# Patient Record
Sex: Female | Born: 1970 | Race: White | Hispanic: Yes | Marital: Married | Smoking: Never smoker
Health system: Southern US, Community
[De-identification: ages and names within clinical notes are randomized; demographics above are authoritative.]

## PROBLEM LIST (undated history)

## (undated) DIAGNOSIS — F329 Major depressive disorder, single episode, unspecified: Secondary | ICD-10-CM

## (undated) DIAGNOSIS — F32A Depression, unspecified: Secondary | ICD-10-CM

## (undated) DIAGNOSIS — I10 Essential (primary) hypertension: Secondary | ICD-10-CM

## (undated) DIAGNOSIS — G43909 Migraine, unspecified, not intractable, without status migrainosus: Secondary | ICD-10-CM

## (undated) HISTORY — DX: Depression, unspecified: F32.A

## (undated) HISTORY — DX: Migraine, unspecified, not intractable, without status migrainosus: G43.909

---
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
## 2003-12-21 ENCOUNTER — Ambulatory Visit (HOSPITAL_COMMUNITY): Admission: RE | Admit: 2003-12-21 | Discharge: 2003-12-21 | Payer: Self-pay | Admitting: Internal Medicine

## 2004-05-23 ENCOUNTER — Ambulatory Visit: Payer: Self-pay | Admitting: Nurse Practitioner

## 2004-08-22 ENCOUNTER — Ambulatory Visit (HOSPITAL_COMMUNITY): Admission: RE | Admit: 2004-08-22 | Discharge: 2004-08-22 | Payer: Self-pay | Admitting: *Deleted

## 2005-01-06 ENCOUNTER — Inpatient Hospital Stay (HOSPITAL_COMMUNITY): Admission: AD | Admit: 2005-01-06 | Discharge: 2005-01-08 | Payer: Self-pay | Admitting: Obstetrics & Gynecology

## 2005-01-06 ENCOUNTER — Ambulatory Visit: Payer: Self-pay | Admitting: *Deleted

## 2012-07-04 ENCOUNTER — Encounter (HOSPITAL_COMMUNITY): Payer: Self-pay | Admitting: *Deleted

## 2012-07-04 ENCOUNTER — Emergency Department (HOSPITAL_COMMUNITY)
Admission: EM | Admit: 2012-07-04 | Discharge: 2012-07-05 | Disposition: A | Discharge status: Home / Self Care | Payer: Self-pay | Attending: Emergency Medicine | Admitting: Emergency Medicine

## 2012-07-04 DIAGNOSIS — R109 Unspecified abdominal pain: Secondary | ICD-10-CM | POA: Insufficient documentation

## 2012-07-04 DIAGNOSIS — R1110 Vomiting, unspecified: Secondary | ICD-10-CM

## 2012-07-04 DIAGNOSIS — R112 Nausea with vomiting, unspecified: Secondary | ICD-10-CM | POA: Insufficient documentation

## 2012-07-04 DIAGNOSIS — R6883 Chills (without fever): Secondary | ICD-10-CM | POA: Insufficient documentation

## 2012-07-04 DIAGNOSIS — R51 Headache: Secondary | ICD-10-CM | POA: Insufficient documentation

## 2012-07-04 LAB — COMPREHENSIVE METABOLIC PANEL
ALT: 18 U/L (ref 0–35)
Albumin: 4 g/dL (ref 3.5–5.2)
Alkaline Phosphatase: 90 U/L (ref 39–117)
BUN: 6 mg/dL (ref 6–23)
Calcium: 8.6 mg/dL (ref 8.4–10.5)
Potassium: 3.2 mEq/L — ABNORMAL LOW (ref 3.5–5.1)
Sodium: 130 mEq/L — ABNORMAL LOW (ref 135–145)
Total Protein: 7.7 g/dL (ref 6.0–8.3)

## 2012-07-04 LAB — CBC WITH DIFFERENTIAL/PLATELET
Basophils Relative: 0 % (ref 0–1)
Eosinophils Absolute: 0 10*3/uL (ref 0.0–0.7)
Eosinophils Relative: 0 % (ref 0–5)
MCH: 26.9 pg (ref 26.0–34.0)
MCHC: 33.9 g/dL (ref 30.0–36.0)
Neutrophils Relative %: 91 % — ABNORMAL HIGH (ref 43–77)
Platelets: 268 10*3/uL (ref 150–400)
RDW: 13.8 % (ref 11.5–15.5)

## 2012-07-04 MED ORDER — MORPHINE SULFATE 4 MG/ML IJ SOLN
6.0000 mg | Freq: Once | INTRAMUSCULAR | Status: AC
  Administered 2012-07-04: 6 mg via INTRAVENOUS
  Filled 2012-07-04: qty 2

## 2012-07-04 MED ORDER — SODIUM CHLORIDE 0.9 % IV BOLUS (SEPSIS)
1000.0000 mL | Freq: Once | INTRAVENOUS | Status: AC
  Administered 2012-07-04: 1000 mL via INTRAVENOUS

## 2012-07-04 MED ORDER — ONDANSETRON HCL 4 MG/2ML IJ SOLN
4.0000 mg | Freq: Once | INTRAMUSCULAR | Status: AC
  Administered 2012-07-04: 4 mg via INTRAVENOUS
  Filled 2012-07-04: qty 2

## 2012-07-04 NOTE — ED Provider Notes (Signed)
History     CSN: [id number]  Arrival date & time 07/04/12  2221   First MD Initiated Contact with Patient 07/04/12 2246      Chief Complaint  Patient presents with  . Emesis  . Chills    (Consider location/radiation/quality/duration/timing/severity/associated sxs/prior treatment) HPI  Caroline Patton is a 41 y.o. female complaining of multiple episodes of nonbloody nonbilious emesis starting today at 3:30. Patient also reports headache, fever/chills and neck pain. She reports abdominal discomfort and denies diarrhea, dysuria, shortness of breath, cough, sore throat, myalgia, sick contacts. HA is 10/10 bilateral frontal. Pt has not had flu shot this year.   History reviewed. No pertinent past medical history.  History reviewed. No pertinent past surgical history.  No family history on file.  History  Substance Use Topics  . Smoking status: Never Smoker   . Smokeless tobacco: Not on file  . Alcohol Use: No    OB History    Grav Para Term Preterm Abortions TAB SAB Ect Mult Living                  Review of Systems  Constitutional: Negative for fever.  Respiratory: Negative for shortness of breath.   Cardiovascular: Negative for chest pain.  Gastrointestinal: Positive for nausea, vomiting and abdominal pain. Negative for diarrhea.  Neurological: Positive for headaches.  All other systems reviewed and are negative.    Allergies  Review of patient's allergies indicates no known allergies.  Home Medications   Current Outpatient Rx  Name  Route  Sig  Dispense  Refill  . ACETAMINOPHEN 325 MG PO TABS   Oral   Take 650 mg by mouth every 6 (six) hours as needed. For headaches           BP 161/99  Pulse 102  Temp 97.8 F (36.6 C) (Oral)  Resp 16  SpO2 99%  LMP 07/04/2012  Physical Exam  Nursing note and vitals reviewed. Constitutional: She is oriented to person, place, and time. She appears well-developed and well-nourished. No distress.  HENT:    Head: Normocephalic.  Mouth/Throat: Oropharynx is clear and moist.  Eyes: Conjunctivae normal and EOM are normal. Pupils are equal, round, and reactive to light.  Neck: Normal range of motion. Neck supple.       No meningeal signs, FROM   Cardiovascular: Normal rate, regular rhythm and intact distal pulses.   Pulmonary/Chest: Effort normal and breath sounds normal. No stridor. No respiratory distress. She has no wheezes. She has no rales. She exhibits no tenderness.  Abdominal: Soft. Bowel sounds are normal. She exhibits no distension and no mass. There is no tenderness. There is no rebound and no guarding.  Musculoskeletal: Normal range of motion.  Neurological: She is alert and oriented to person, place, and time.       Strength is 5/5 x4 extremities, distal sensation grossly intact. Pt ambulates with a coordinated gait.   Psychiatric: She has a normal mood and affect.    ED Course  Procedures (including critical care time)  Labs Reviewed  CBC WITH DIFFERENTIAL - Abnormal; Notable for the following:    Neutrophils Relative 91 (*)     Neutro Abs 9.6 (*)     Lymphocytes Relative 7 (*)     Monocytes Relative 2 (*)     All other components within normal limits  COMPREHENSIVE METABOLIC PANEL - Abnormal; Notable for the following:    Sodium 130 (*)     Potassium 3.2 (*)  Glucose, Bld 144 (*)     Creatinine, Ser 0.35 (*)     Total Bilirubin 0.2 (*)     All other components within normal limits   No results found.   1. Vomiting       MDM  The patient reports subjective improvement after IV fluids, Zofran and morphine. Patient is mildly hypokalemic at 3.2 over cleared her orally this will also be a by mouth challenge.  Patient is tolerating by mouth and is amenable to discharge. I will discharge her with Zofran and Percocet. Return precautions given      Wynetta Emery, PA-C 07/05/12 [phone]

## 2012-07-04 NOTE — ED Notes (Signed)
Patient states she is unable to eat or drink starting about 1530 today, patient also c/o fevers/chills

## 2012-07-05 MED ORDER — EPINEPHRINE HCL 0.1 MG/ML IJ SOLN
INTRAMUSCULAR | Status: AC
  Filled 2012-07-05: qty 40

## 2012-07-05 MED ORDER — POTASSIUM CHLORIDE 20 MEQ/15ML (10%) PO LIQD
40.0000 meq | Freq: Once | ORAL | Status: AC
  Administered 2012-07-05: 40 meq via ORAL
  Filled 2012-07-05: qty 30

## 2012-07-05 MED ORDER — ONDANSETRON HCL 4 MG PO TABS: 4.0000 mg | ORAL_TABLET | Freq: Three times a day (TID) | ORAL | Status: DC | PRN

## 2012-07-05 MED ORDER — OXYCODONE-ACETAMINOPHEN 5-325 MG PO TABS: ORAL_TABLET | ORAL | Status: DC

## 2012-07-05 NOTE — ED Notes (Signed)
Pt tolerates water well.  Pt states that it doesn't bother her to drink now.

## 2012-07-06 NOTE — ED Provider Notes (Signed)
Medical screening examination/treatment/procedure(s) were performed by non-physician practitioner and as supervising physician I was immediately available for consultation/collaboration.   Lyanne Co, MD 07/06/12 2052

## 2013-07-01 ENCOUNTER — Ambulatory Visit: Payer: Self-pay | Admitting: Emergency Medicine

## 2013-07-01 VITALS — BP 120/82 | HR 72 | Temp 98.0°F | Resp 16 | Ht 63.75 in | Wt 173.8 lb

## 2013-07-01 DIAGNOSIS — G43909 Migraine, unspecified, not intractable, without status migrainosus: Secondary | ICD-10-CM

## 2013-07-01 MED ORDER — BUTALBITAL-APAP-CAFFEINE 50-325-40 MG PO TABS: 1.0000 | ORAL_TABLET | Freq: Four times a day (QID) | ORAL | Status: AC | PRN

## 2013-07-01 NOTE — Progress Notes (Signed)
  Subjective:    Patient ID: Caroline Patton, female    DOB: 17-May-1971, 42 y.o.   MRN: [id number]  HPI 42 yo female with complaint of headache.  Onset over weekend.  Positive blurry vision and nausea.  Frontal headache.  Took Motrin with mild relief, but not complete.  No diagnosis of chronic headaches in past.  Admits to similar headaches, not always as severe about 1-2x/month for past 10 plus years.  Never saw MD about except on ED visit last year.  PPMH:  Negative for diabetes or hypertension  SH:  Non smoker   Review of Systems  Constitutional: Negative for fever and chills.  HENT: Negative for postnasal drip, rhinorrhea and sinus pressure.   Eyes: Positive for photophobia and visual disturbance.  Gastrointestinal: Positive for nausea and vomiting.  Musculoskeletal: Negative for neck pain and neck stiffness.  Skin: Negative for rash.  Neurological: Positive for headaches. Negative for weakness and numbness.       Objective:   Physical Exam Blood pressure 120/82, pulse 72, temperature 98 F (36.7 C), temperature source Oral, resp. rate 16, height 5' 3.75" (1.619 m), weight 173 lb 12.8 oz (78.835 kg), last menstrual period 06/23/2013, SpO2 98.00%. Body mass index is 30.08 kg/(m^2). Well-developed, well nourished female who is awake, alert and oriented, in NAD. HEENT: [place]/AT, PERRL, EOMI.  Sclera and conjunctiva are clear. OP is clear. Neck: supple, non-tender, negative Kernig's/Brudzinski's Heart: RRR, no murmur Lungs: normal effort, CTA Abdomen: normo-active bowel sounds, supple, non-tender, no mass or organomegaly. Extremities: no cyanosis, clubbing or edema. Skin: warm and dry without rash. Psychologic: good mood and appropriate affect, normal speech and behavior. Neuro:  No weakness or numbness.  Sensation intact.  CN2-12 intact.        Assessment & Plan:  Symptoms consistent with migraine headache pattern with onset years ago.  Will give rx for Fioricet to take  when headaches start.  Diganosis:  Migraine.

## 2013-07-01 NOTE — Patient Instructions (Signed)
Cefalea migrañosa °(Migraine Headache) ° Una migraña es un dolor intenso y punzante en uno o ambos lados de la cabeza. Puede durar desde 30 minutos hasta varias horas.  °CAUSAS  °No siempre se conoce la causa exacta. Sin embargo, pueden aparecer cuando los nervios del cerebro se irritan y liberan ciertas sustancias químicas que causan inflamación. Esto ocasiona dolor.  °SÍNTOMAS  °· Dolor en uno o ambos lados de la cabeza. °· Dolor punzante o con pulsaciones. °· Dolor que es lo suficientemente grave en intensidad como para impedir las actividades habituales. °· Se agrava por cualquier actividad física habitual. °· Náuseas, vómitos o ambos. °· Mareos. °· Dolor ante la exposición a luces brillantes o a ruidos fuertes o con la actividad. °· Sensibilidad general a las luces brillantes, a los ruidos fuertes o a los olores. °Antes de sentir a una migraña, puede recibir señales de advertencia de que está por aparecer (aura). Un aura puede incluir:  °· Visión de luces intermitentes. °· Visión de puntos brillantes, halos o líneas en zigzag. °· Visión en túnel o visión borrosa. °· Sensación de entumecimiento u hormigueo. °· Tener dificultad para hablar. °· Tener debilidad muscular. °DISPARADORES DE LA CEFALEA MIGRAÑOSA °· Beber alcohol. °· El hábito de fumar. °· El estrés. °· Con la menstruación. °· Quesos estacionados. °· Alimentos o bebidas que contienen nitratos, glutamato, aspartamo o tiramina. °· La falta de sueño. °· Chocolate. °· Cafeína. °· Hambre. °· Con una actividad física extenuante. °· Fatiga. °· Los medicamentos utilizados para tratar el dolor en el pecho (nitroglicerina), las píldoras anticonceptivas, los estrógenos y algunos medicamentos para la hipertensión pueden provocarla. °DIAGNÓSTICO °Una cefalea migrañosa a menudo se diagnostica según: °· Síntomas. °· Examen físico. °· Una TC (tomografía computada) o resonancia magnética de la cabeza. °TRATAMIENTO °Le prescribirán medicamentos para aliviar el dolor y  las náuseas. También podrán administrarse medicamentos para ayudar a prevenir las migrañas recurrentes.  °INSTRUCCIONES PARA EL CUIDADO EN EL HOGAR  °· Sólo tome medicamentos de venta libre o recetados para calmar el dolor o el malestar, según las indicaciones de su médico. No se recomienda usar analgésicos narcóticos a largo plazo. °· Acuéstese en un cuarto oscuro y tranquilo cuando tiene migraña. °· Lleve un registro diario para averiguar lo que puede provocar dolores de cabeza por la migraña. Por ejemplo, escriba: °· Lo que come y bebe. °· Cuánto tiempo duerme. °· Todo cambio en la dieta o medicamentos. °· Limite el consumo de bebidas alcohólicas. °· Si fuma, deje de hacerlo. °· Duerma entre 7 y 9 horas o como lo indique su médico. °· Limite las situaciones de estrés. °· Mantenga las luces tenues si le molestan las luces brillantes y la migraña empeora. °SOLICITE ATENCIÓN MÉDICA DE INMEDIATO SI:  °· La migraña se hace cada vez más intensa. °· Tiene fiebre. °· Presenta rigidez en el cuello. °· Tiene pérdida de visión. °· Presenta debilidad muscular o pérdida del control muscular. °· Comienza a perder el equilibrio o tiene problemas para caminar. °· Sufre mareos o se desmaya. °· Tiene síntomas graves que son diferentes a los primeros síntomas. °ASEGÚRESE QUE:  °· Comprende estas instrucciones. °· Controlará su enfermedad. °· Solicitará ayuda de inmediato si no mejora o empeora. °Document Released: 07/31/2005 Document Revised: 10/23/2011 °Exit[employer]® Patient Information ©2014 [employer]. ° °

## 2014-05-04 ENCOUNTER — Other Ambulatory Visit (HOSPITAL_COMMUNITY): Payer: Self-pay | Admitting: Nurse Practitioner

## 2014-05-04 DIAGNOSIS — Z1231 Encounter for screening mammogram for malignant neoplasm of breast: Secondary | ICD-10-CM

## 2014-05-20 ENCOUNTER — Ambulatory Visit (HOSPITAL_COMMUNITY)
Admission: RE | Admit: 2014-05-20 | Discharge: 2014-05-20 | Disposition: A | Discharge status: Home / Self Care | Payer: Self-pay | Source: Ambulatory Visit | Attending: Nurse Practitioner | Admitting: Nurse Practitioner

## 2014-05-20 DIAGNOSIS — Z1231 Encounter for screening mammogram for malignant neoplasm of breast: Secondary | ICD-10-CM

## 2015-02-19 ENCOUNTER — Ambulatory Visit (INDEPENDENT_AMBULATORY_CARE_PROVIDER_SITE_OTHER): Payer: Self-pay | Admitting: Emergency Medicine

## 2015-02-19 VITALS — BP 120/80 | HR 68 | Temp 98.3°F | Resp 16 | Ht 64.0 in | Wt 163.6 lb

## 2015-02-19 DIAGNOSIS — L84 Corns and callosities: Secondary | ICD-10-CM

## 2015-02-19 NOTE — Progress Notes (Signed)
Subjective:  Patient ID: Caroline Patton, female    DOB: 10-15-1970  Age: 44 y.o. MRN: [id number]  CC: Foot Injury   HPI Caroline Patton presents  With pain on the lateral surface of he rleft fourth toe.she has been wearing shoes at work that are too tight for her and she has a callus formation on her toe. There is some somewhat painful. She tried using a wart freezing compound that fails to improve her situation.  History Colleen has no past medical history on file.   She has no past surgical history on file.   Her  family history is not on file.  She   reports that she has never smoked. She does not have any smokeless tobacco history on file. She reports that she does not drink alcohol or use illicit drugs.  Outpatient Prescriptions Prior to Visit  Medication Sig Dispense Refill  . acetaminophen (TYLENOL) 325 MG tablet Take 650 mg by mouth every 6 (six) hours as needed. For headaches    . ibuprofen (ADVIL,MOTRIN) 200 MG tablet Take 400 mg by mouth every 6 (six) hours as needed for headache.    . ondansetron (ZOFRAN) 4 MG tablet Take 1 tablet (4 mg total) by mouth every 8 (eight) hours as needed for nausea. (Patient not taking: Reported on 02/19/2015) 10 tablet 0  . oxyCODONE-acetaminophen (PERCOCET/ROXICET) 5-325 MG per tablet 1 to 2 tabs PO q6hrs  PRN for pain (Patient not taking: Reported on 02/19/2015) 15 tablet 0   No facility-administered medications prior to visit.    History   Social History  . Marital Status: Married    Spouse Name: N/A  . Number of Children: N/A  . Years of Education: N/A   Social History Main Topics  . Smoking status: Never Smoker   . Smokeless tobacco: Not on file  . Alcohol Use: No  . Drug Use: No  . Sexual Activity: Not on file   Other Topics Concern  . None   Social History Narrative     Review of Systems  Constitutional: Negative for fever, chills and appetite change.  HENT: Negative for congestion, ear pain,  postnasal drip, sinus pressure and sore throat.   Eyes: Negative for pain and redness.  Respiratory: Negative for cough, shortness of breath and wheezing.   Cardiovascular: Negative for leg swelling.  Gastrointestinal: Negative for nausea, vomiting, abdominal pain, diarrhea, constipation and blood in stool.  Endocrine: Negative for polyuria.  Genitourinary: Negative for dysuria, urgency, frequency and flank pain.  Musculoskeletal: Negative for gait problem.  Skin: Negative for rash.  Neurological: Negative for weakness and headaches.  Psychiatric/Behavioral: Negative for confusion and decreased concentration. The patient is not nervous/anxious.     Objective:  BP 120/80 mmHg  Pulse 68  Temp(Src) 98.3 F (36.8 C) (Oral)  Resp 16  Ht 5\' 4"  (1.626 m)  Wt 163 lb 9.6 oz (74.208 kg)  BMI 28.07 kg/m2  SpO2 98%  LMP 02/13/2015  Physical Exam  Constitutional: She is oriented to person, place, and time. She appears well-developed and well-nourished.  HENT:  Head: Normocephalic and atraumatic.  Eyes: Conjunctivae are normal. Pupils are equal, round, and reactive to light.  Pulmonary/Chest: Effort normal.  Musculoskeletal: She exhibits no edema.  Neurological: She is alert and oriented to person, place, and time.  Skin: Skin is dry.  Psychiatric: She has a normal mood and affect. Her behavior is normal. Thought content normal.  she has a callus on the lateral surface of her fourth  toe. No cellulitis or evidence of infection   Assessment & Plan:   Eithel was seen today for foot injury.  Diagnoses and all orders for this visit:  Callus   I am having Ms. Patton maintain her acetaminophen, ondansetron, oxyCODONE-acetaminophen, and ibuprofen.  No orders of the defined types were placed in this encounter.   She was instructed to use a cotton pad between her 2 toes pending purchase of new parachutist.  Appropriate red flag conditions were discussed with the patient as well  as actions that should be taken.  Patient expressed his understanding.  Follow-up: Return if symptoms worsen or fail to improve.  Carmelina Dane, MD

## 2015-05-13 ENCOUNTER — Other Ambulatory Visit: Payer: Self-pay | Admitting: Nurse Practitioner

## 2015-05-13 DIAGNOSIS — Z1231 Encounter for screening mammogram for malignant neoplasm of breast: Secondary | ICD-10-CM

## 2015-05-26 ENCOUNTER — Ambulatory Visit
Admission: RE | Admit: 2015-05-26 | Discharge: 2015-05-26 | Disposition: A | Discharge status: Home / Self Care | Payer: No Typology Code available for payment source | Source: Ambulatory Visit | Attending: Nurse Practitioner | Admitting: Nurse Practitioner

## 2015-05-26 DIAGNOSIS — Z1231 Encounter for screening mammogram for malignant neoplasm of breast: Secondary | ICD-10-CM

## 2016-04-16 ENCOUNTER — Emergency Department (HOSPITAL_COMMUNITY): Payer: Self-pay

## 2016-04-16 ENCOUNTER — Emergency Department (HOSPITAL_COMMUNITY)
Admission: EM | Admit: 2016-04-16 | Discharge: 2016-04-16 | Disposition: A | Discharge status: Home / Self Care | Payer: Self-pay | Attending: Emergency Medicine | Admitting: Emergency Medicine

## 2016-04-16 ENCOUNTER — Encounter (HOSPITAL_COMMUNITY): Payer: Self-pay | Admitting: Nurse Practitioner

## 2016-04-16 DIAGNOSIS — R030 Elevated blood-pressure reading, without diagnosis of hypertension: Secondary | ICD-10-CM | POA: Insufficient documentation

## 2016-04-16 DIAGNOSIS — IMO0001 Reserved for inherently not codable concepts without codable children: Secondary | ICD-10-CM

## 2016-04-16 DIAGNOSIS — R51 Headache: Secondary | ICD-10-CM | POA: Insufficient documentation

## 2016-04-16 DIAGNOSIS — R519 Headache, unspecified: Secondary | ICD-10-CM

## 2016-04-16 LAB — I-STAT CHEM 8, ED
BUN: 6 mg/dL (ref 6–20)
CHLORIDE: 104 mmol/L (ref 101–111)
Calcium, Ion: 1.09 mmol/L — ABNORMAL LOW (ref 1.15–1.40)
Creatinine, Ser: 0.3 mg/dL — ABNORMAL LOW (ref 0.44–1.00)
Glucose, Bld: 132 mg/dL — ABNORMAL HIGH (ref 65–99)
HEMATOCRIT: 41 % (ref 36.0–46.0)
Hemoglobin: 13.9 g/dL (ref 12.0–15.0)
Potassium: 3.6 mmol/L (ref 3.5–5.1)
SODIUM: 138 mmol/L (ref 135–145)
TCO2: 23 mmol/L (ref 0–100)

## 2016-04-16 MED ORDER — DEXAMETHASONE SODIUM PHOSPHATE 10 MG/ML IJ SOLN
10.0000 mg | Freq: Once | INTRAMUSCULAR | Status: AC
  Administered 2016-04-16: 10 mg via INTRAVENOUS
  Filled 2016-04-16: qty 1

## 2016-04-16 MED ORDER — SODIUM CHLORIDE 0.9 % IV BOLUS (SEPSIS)
1000.0000 mL | Freq: Once | INTRAVENOUS | Status: AC
  Administered 2016-04-16: 1000 mL via INTRAVENOUS

## 2016-04-16 MED ORDER — KETOROLAC TROMETHAMINE 30 MG/ML IJ SOLN
15.0000 mg | Freq: Once | INTRAMUSCULAR | Status: AC
  Administered 2016-04-16: 15 mg via INTRAVENOUS
  Filled 2016-04-16: qty 1

## 2016-04-16 MED ORDER — METOCLOPRAMIDE HCL 5 MG/ML IJ SOLN
10.0000 mg | Freq: Once | INTRAMUSCULAR | Status: AC
  Administered 2016-04-16: 10 mg via INTRAVENOUS
  Filled 2016-04-16: qty 2

## 2016-04-16 MED ORDER — DIPHENHYDRAMINE HCL 50 MG/ML IJ SOLN
25.0000 mg | Freq: Once | INTRAMUSCULAR | Status: AC
  Administered 2016-04-16: 25 mg via INTRAVENOUS
  Filled 2016-04-16: qty 1

## 2016-04-16 NOTE — ED Provider Notes (Signed)
[facility]-EMERGENCY DEPT Provider Note   CSN: [id number] Arrival date & time: 04/16/16  1259     History   Chief Complaint Chief Complaint  Patient presents with  . Headache    HPI Caroline Patton is a 45 y.o. female.  Caroline Patton is a 45 y.o. Female with h/o migraine headaches presents to ED with complaint of headache. Patient states frontal headache started approximately 3 days ago, gradual in onset, described as a pressure sensation. She tried taking advil migraine with minimal relief. Patient reports associated chills, nausea, vomiting, sensitivity to light and sound, neck pain and stiffness, and lightheadedness. She denies fever, trouble swallowing, blurry vision, diplopia, chest pain, abdominal pain, dysuria, hematuria, or weakness. No head trauma. Patient has subjective feeling of fingers "asleep" b/l. Patient has a history of headaches, states sxs are her typical HA sx; however, did not resolve with medicine. She has recently been getting headaches every 2 weeks. No PCP.    The history is provided by the patient. The history is limited by a language barrier. A language interpreter was used.    History reviewed. No pertinent past medical history.  There are no active problems to display for this patient.   History reviewed. No pertinent surgical history.  OB History    No data available       Home Medications    Prior to Admission medications   Medication Sig Start Date End Date Taking? Authorizing Provider  ibuprofen (ADVIL,MOTRIN) 200 MG tablet Take 400 mg by mouth 3 (three) times daily as needed for headache (pain).    Yes Historical Provider, MD  Multiple Vitamin (MULTIVITAMIN WITH MINERALS) TABS tablet Take 1 tablet by mouth daily.   Yes Historical Provider, MD  ondansetron (ZOFRAN) 4 MG tablet Take 1 tablet (4 mg total) by mouth every 8 (eight) hours as needed for nausea. Patient not taking: Reported on 02/19/2015 07/05/12   Joni Reining Pisciotta, PA-C    oxyCODONE-acetaminophen (PERCOCET/ROXICET) 5-325 MG per tablet 1 to 2 tabs PO q6hrs  PRN for pain Patient not taking: Reported on 02/19/2015 07/05/12   Wynetta Emery, PA-C    Family History History reviewed. No pertinent family history.  Social History Social History  Substance Use Topics  . Smoking status: Never Smoker  . Smokeless tobacco: Never Used  . Alcohol use No     Allergies   Review of patient's allergies indicates no known allergies.   Review of Systems Review of Systems  Constitutional: Positive for chills. Negative for diaphoresis and fever.  HENT: Negative for trouble swallowing.   Eyes: Positive for photophobia. Negative for visual disturbance.  Respiratory: Negative for shortness of breath.   Cardiovascular: Negative for chest pain.  Gastrointestinal: Positive for nausea and vomiting. Negative for abdominal pain, blood in stool, constipation and diarrhea.  Genitourinary: Negative for dysuria and hematuria.  Musculoskeletal: Positive for neck pain and neck stiffness.  Skin: Negative for rash.  Neurological: Positive for light-headedness and headaches. Negative for syncope and weakness.       "fingers feel asleep b/l"     Physical Exam Updated Vital Signs BP 176/96   Pulse 90   Temp 98.2 F (36.8 C) (Oral)   Resp 24   SpO2 97%   Physical Exam  Constitutional: She appears well-developed and well-nourished. No distress.  HENT:  Head: Normocephalic and atraumatic.  Mouth/Throat: Oropharynx is clear and moist. No oropharyngeal exudate.  Eyes: Conjunctivae and EOM are normal. Pupils are equal, round, and reactive to light. Right eye exhibits  no discharge. Left eye exhibits no discharge. No scleral icterus.  Neck: Normal range of motion and phonation normal. Neck supple. No neck rigidity. Normal range of motion present.  Cardiovascular: Normal rate, regular rhythm, normal heart sounds and intact distal pulses.   No murmur heard. Pulmonary/Chest: Effort  normal and breath sounds normal. No stridor. No respiratory distress. She has no wheezes. She has no rales.  Abdominal: Soft. Bowel sounds are normal. She exhibits no distension. There is no tenderness. There is no rigidity, no rebound, no guarding and no CVA tenderness.  Musculoskeletal: Normal range of motion.  Lymphadenopathy:    She has no cervical adenopathy.  Neurological: She is alert. She is not disoriented. Coordination and gait normal. GCS eye subscore is 4. GCS verbal subscore is 5. GCS motor subscore is 6.  Mental Status:  Alert, thought content appropriate, able to give a coherent history. Speech fluent without evidence of aphasia. Able to follow 2 step commands without difficulty.  Cranial Nerves:  II:  Peripheral visual fields grossly normal, pupils equal, round, reactive to light III,IV, VI: ptosis not present, extra-ocular motions intact bilaterally  V,VII: smile symmetric, facial light touch sensation equal VIII: hearing grossly normal to voice  X: uvula elevates symmetrically  XI: bilateral shoulder shrug symmetric and strong XII: midline tongue extension without fassiculations Motor:  Normal tone. 5/5 in upper and lower extremities bilaterally including strong and equal grip strength and dorsiflexion/plantar flexion Sensory: light touch normal in all extremities. Cerebellar: normal finger-to-nose with bilateral upper extremities Gait: normal gait and balance CV: distal pulses palpable throughout  Negative pronator drift  Skin: Skin is warm and dry. She is not diaphoretic.  Psychiatric: She has a normal mood and affect. Her behavior is normal.     ED Treatments / Results  Labs (all labs ordered are listed, but only abnormal results are displayed) Labs Reviewed  I-STAT CHEM 8, ED - Abnormal; Notable for the following:       Result Value   Creatinine, Ser 0.30 (*)    Glucose, Bld 132 (*)    Calcium, Ion 1.09 (*)    All other components within normal limits     EKG  EKG Interpretation None       Radiology Ct Head Wo Contrast  Result Date: 04/16/2016 CLINICAL DATA:  Three-day history of headache and chills. Initial encounter. EXAM: CT HEAD WITHOUT CONTRAST TECHNIQUE: Contiguous axial images were obtained from the base of the skull through the vertex without intravenous contrast. COMPARISON:  None. FINDINGS: Brain: Appears normal without hemorrhage, infarct, mass lesion, mass effect, midline shift or abnormal extra-axial fluid collection. No hydrocephalus or pneumocephalus. Vascular: Unremarkable. Skull: Intact. Sinuses/Orbits: Unremarkable. Other: None. IMPRESSION: Negative head CT. Electronically Signed   By: Drusilla Kanner M.D.   On: 04/16/2016 20:54    Procedures Procedures (including critical care time)  Medications Ordered in ED Medications  sodium chloride 0.9 % bolus 1,000 mL (0 mLs Intravenous Stopped 04/16/16 1745)  metoCLOPramide (REGLAN) injection 10 mg (10 mg Intravenous Given 04/16/16 1633)  diphenhydrAMINE (BENADRYL) injection 25 mg (25 mg Intravenous Given 04/16/16 1633)  ketorolac (TORADOL) 30 MG/ML injection 15 mg (15 mg Intravenous Given 04/16/16 1758)  dexamethasone (DECADRON) injection 10 mg (10 mg Intravenous Given 04/16/16 1758)   Vitals:   04/16/16 1930 04/16/16 2000 04/16/16 2030 04/16/16 2106  BP: 160/99 168/99 158/96 153/97  Pulse: 83 82 81 93  Resp:    18  Temp:      TempSrc:  SpO2: 97% 98% 97% 98%     Initial Impression / Assessment and Plan / ED Course  I have reviewed the triage vital signs and the nursing notes.  Pertinent labs & imaging results that were available during my care of the patient were reviewed by me and considered in my medical decision making (see chart for details).  Clinical Course  Value Comment By Time   On re-evaluation patient endorses improvement in headache to 3/10 Lona KettleAshley Laurel Keaundra Stehle, [place] 09/03 1850  CT Head Wo Contrast Reviewed Lona KettleAshley Laurel Lacresia Darwish, PA-C 09/03 2100     Patient presents to ED with complaint of headache. Patient is afebrile and non-toxic appearing in NAD. Vital signs remarkable for elevated BP, otherwise stable - patient states she has high blood pressure, but not on medication.  No focal neuro deficits, nuchal rigidity, or changes in vision. Low suspicion for meningitis or temporal arteritis. Given HTN will check I-stat chem 8 for kidney function. Given subjective numbness in hands will CT head. Headache cocktail initiated.   Pt HA treated and improved while in ED.  Creatinine re-assuring. CT negative for acute intracranial abnormality - no evidence of hemorrhage, infarct, or mass lesion. Blood pressure improved in ED - ?secondary to pain. Discussed importance of monitoring BP and establishing a PCP, [facility] information provided. [facility] contact provided for further management of headaches and possible need for prophylactic medication. Discussed symptomatic management. Return precautions provided. Patient voiced understanding and is agreeable.   Interpreter used for discussion of results and discharge instructions.     Final Clinical Impressions(s) / ED Diagnoses   Final diagnoses:  Elevated blood pressure  Nonintractable headache, unspecified chronicity pattern, unspecified headache type    New Prescriptions Discharge Medication List as of 04/16/2016  9:23 PM       Lona KettleAshley Laurel Taron Mondor, PA-C 04/16/16 [id number]    Shaune Pollackameron Isaacs, MD 04/17/16 1140

## 2016-04-16 NOTE — ED Notes (Signed)
Patient returned from CT

## 2016-04-16 NOTE — ED Notes (Signed)
Patient transported to CT 

## 2016-04-16 NOTE — Discharge Instructions (Signed)
Read the information below.   Your CT head was normal. Your labs were re-assuring. Your headache improved with treatment.  Continue advil migraine for next two days.  Stay hydrated.  Your blood pressure is elevated. It is important to have this managed by a primary doctor. I have [facility] to establish a primary care doctor. Please call.  I have provided the contact information for [facility] for further management of headaches.  You may return to the Emergency Department at any time for worsening condition or any new symptoms that concern you. Return to ED if you develop fever, numbness/weakness, changes in vision, chest pain, shortness of breath.

## 2016-04-16 NOTE — ED Triage Notes (Addendum)
Pt c/o 3 day history of headache, chills. She has taken advil migraine with no relief. She reports nausea and vomiting with the headache. She is alert and breathing eaisly

## 2016-04-19 ENCOUNTER — Ambulatory Visit (INDEPENDENT_AMBULATORY_CARE_PROVIDER_SITE_OTHER): Payer: Managed Care, Other (non HMO) | Admitting: Family Medicine

## 2016-04-19 ENCOUNTER — Encounter: Payer: Self-pay | Admitting: Family Medicine

## 2016-04-19 VITALS — BP 130/80 | HR 64 | Temp 98.1°F | Resp 16 | Ht 63.5 in | Wt 177.2 lb

## 2016-04-19 DIAGNOSIS — G8929 Other chronic pain: Secondary | ICD-10-CM

## 2016-04-19 DIAGNOSIS — R51 Headache: Secondary | ICD-10-CM | POA: Diagnosis not present

## 2016-04-19 DIAGNOSIS — R519 Headache, unspecified: Secondary | ICD-10-CM

## 2016-04-19 MED ORDER — TRAMADOL HCL 50 MG PO TABS: 50.0000 mg | ORAL_TABLET | Freq: Three times a day (TID) | ORAL | 1 refills | Status: DC | PRN

## 2016-04-19 MED ORDER — ONDANSETRON HCL 4 MG PO TABS: 4.0000 mg | ORAL_TABLET | Freq: Three times a day (TID) | ORAL | 0 refills | Status: DC | PRN

## 2016-04-19 MED ORDER — SUMATRIPTAN SUCCINATE 50 MG PO TABS: 50.0000 mg | ORAL_TABLET | ORAL | 0 refills | Status: DC | PRN

## 2016-04-19 NOTE — Patient Instructions (Addendum)
Take the Imitrex when you know the headache is starting.  You can repeat this in an hour if you need to.  No more then two a day.  Take the Tramadol for pain relief if this doesn't help.  The Zofran will treat your nausea.  Keep a headache diary.  Take this to your appt at the [facility].    It was good to meet you today.  If you start having worsening headaches, the medication isn't helping, or you are having worsening issues, come back and see us or go to the ED.      IF you received an x-ray today, you will receive an invoice from [facility]. Please contact [facility] at [phone] with questions or concerns regarding your invoice.   IF you received labwork today, you will receive an invoice from [facility]. Please contact [facility] at [phone] with questions or concerns regarding your invoice.   Our billing staff will not be able to assist you with questions regarding bills from these companies.  You will be contacted with the lab results as soon as they are available. The fastest way to get your results is to activate your My Chart account. Instructions are located on the last page of this paperwork. If you have not heard from us regarding the results in 2 weeks, please contact this office.

## 2016-04-19 NOTE — Progress Notes (Signed)
Caroline Patton is a 45 y.o. female who presents to Urgent Care today for headaches  1.  Headaches: Seen on 9/3 for the same.  Present for about 3 days before that.  Usually accompanied by nausea.  Usually unilateral, but can be bandlike in frontal region.  Also has some upper back stiffness and tightness.  Occasional chills.  No fevers, numbness, tingling in extremities.  Eating and drinking well, though eating makes her more nauseous.  Has been taking Advil with minimal relief.  Had negative CT scan of head in ED.    Has history of migraines for past several years.  States these occur once or twice a week.  No recent stressors.  Denies depressive symptoms.   ROS as above.    PMH reviewed. Patient is a nonsmoker.   No past medical history on file. No past surgical history on file.  Medications reviewed. Current Outpatient Prescriptions  Medication Sig Dispense Refill  . oxyCODONE-acetaminophen (PERCOCET/ROXICET) 5-325 MG per tablet 1 to 2 tabs PO q6hrs  PRN for pain (Patient not taking: Reported on 02/19/2015) 15 tablet 0   No current facility-administered medications for this visit.      Physical Exam:  BP 130/80 (BP Location: Right Arm, Patient Position: Sitting, Cuff Size: Normal)   Pulse 64   Temp 98.1 F (36.7 C)   Resp 16   Ht 5' 3.5" (1.613 m)   Wt 177 lb 3.2 oz (80.4 kg)   SpO2 100%   BMI 30.90 kg/m  Gen:  Alert, cooperative patient who appears stated age in no acute distress.  Vital signs reviewed. HEENT: EOMI,  MMM Pulm:  Clear to auscultation bilaterally with good air movement.  No wheezes or rales noted.   Cardiac:  Regular rate and rhythm without murmur auscultated.  Good S1/S2. Abd:  Soft/nondistended/nontender.  Good bowel sounds throughout all four quadrants.  No masses noted.  Exts: Non edematous BL  LE, warm and well perfused.   Assessment and Plan:  1.  Headaches: - past diagnosis of migraines.  Does have nausea with her symptoms.  Band like pain  and occasionally unilateral - has appt with Headache clinic on Oct 2.  Recommended to keep headache diary until that day.  Printed and given to her. - Trial of imitrex - Tramadol for pain relief if imitrex not helping. - zofran for nausea relief.  - warning precautions provided.

## 2018-02-19 IMAGING — CT CT HEAD W/O CM
4 series · 16 of 47 positions shown, 18 images · non-contrast
Comparison: None.

CLINICAL DATA: Three-day history of headache and chills. Initial
encounter.

EXAM:
CT HEAD WITHOUT CONTRAST
TECHNIQUE: Contiguous axial images were obtained from the base of the skull
through the vertex without intravenous contrast.

[Series 2: head without · axial · non-contrast · 0.42mm/px · z∈[+214,+324]mm · 7 of 30 slices shown, 9 images]
[im 4/30  brain]
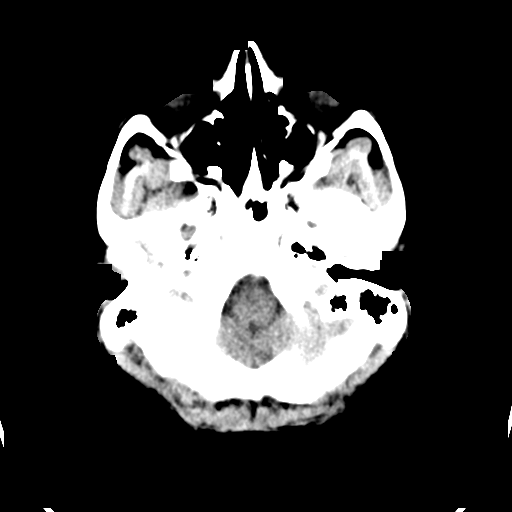
[im 4/30  bone]
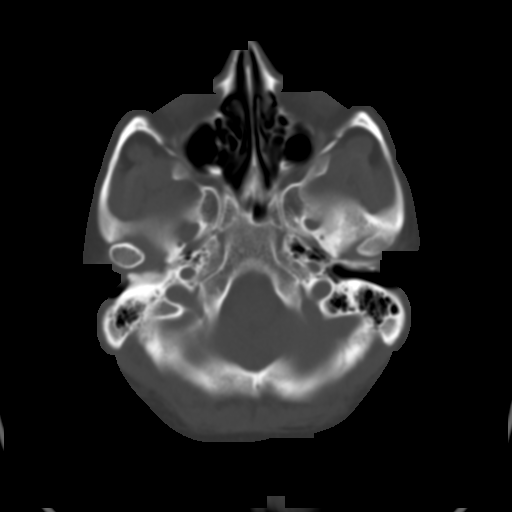
[im 8/30  brain]
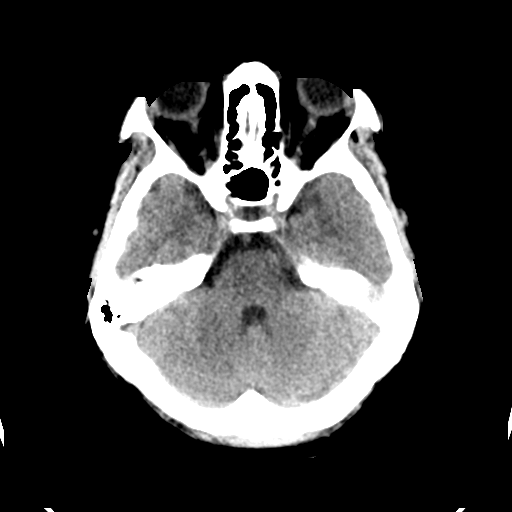
[im 11/30  brain]
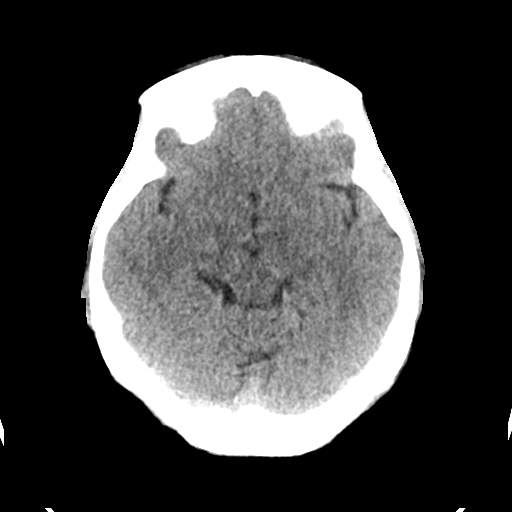
[im 15/30  brain]
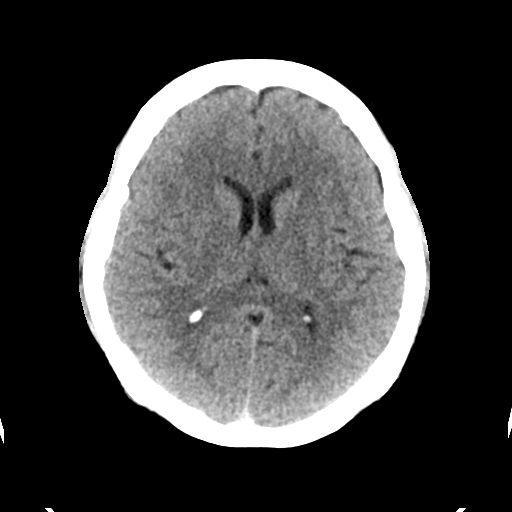
[im 19/30  brain]
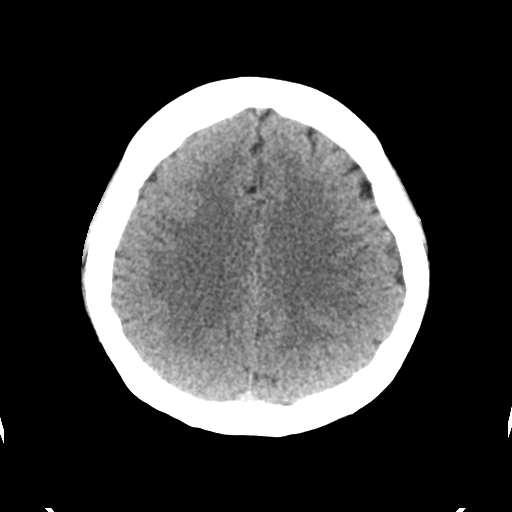
[im 19/30  bone]
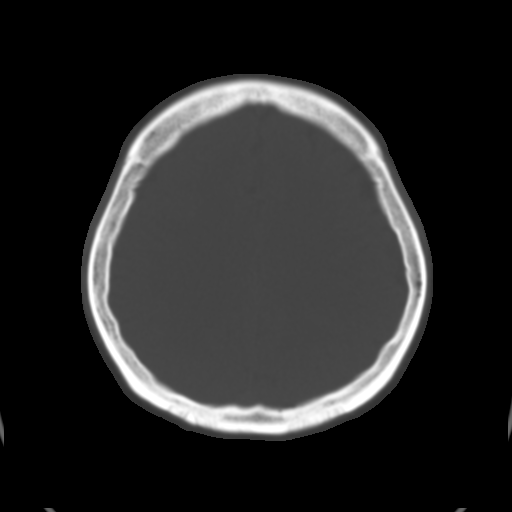
[im 22/30  brain]
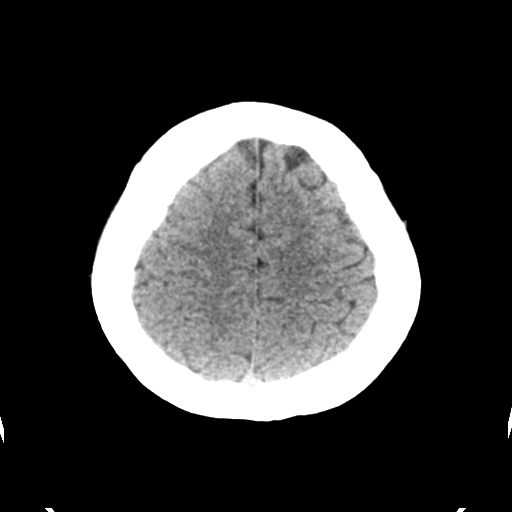
[im 26/30  brain]
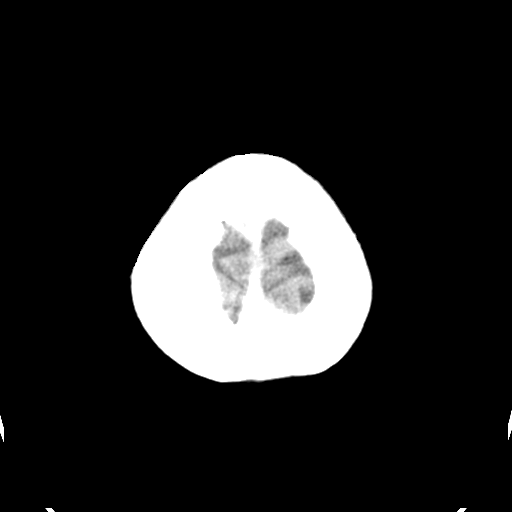

[Series 3: head bone · axial · 0.42mm/px · z∈[+214,+242]mm · 3 of 74 slices shown]
[im 8/74  bone]
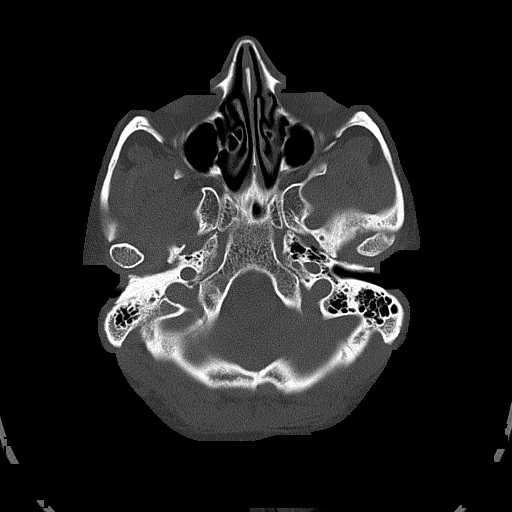
[im 15/74  bone]
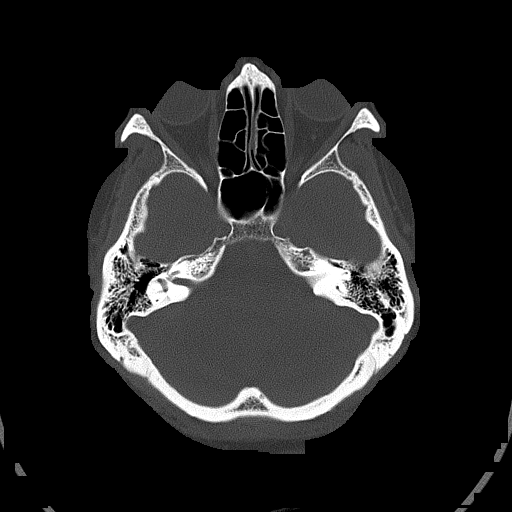
[im 22/74  bone]
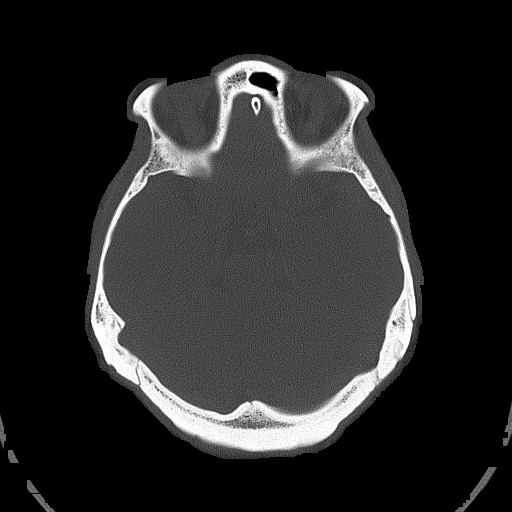

[Series 4: head without cor · coronal · non-contrast · 0.30mm/px · 3 of 60 slices shown]
[im 20/60  brain]
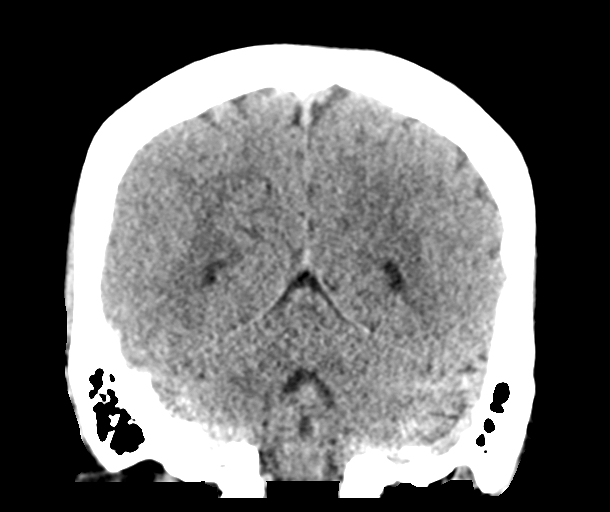
[im 27/60  brain]
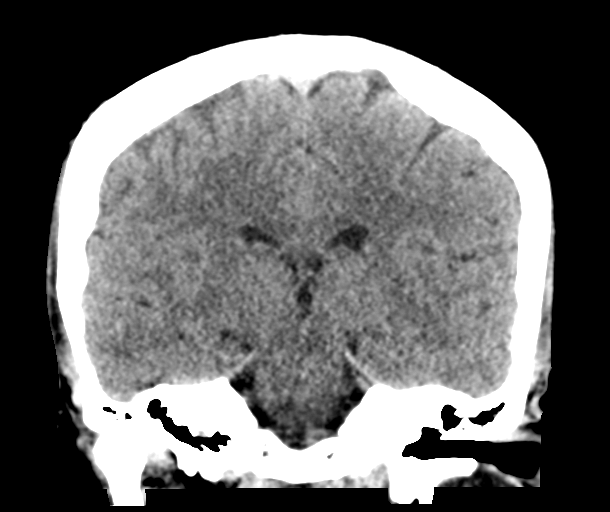
[im 33/60  brain]
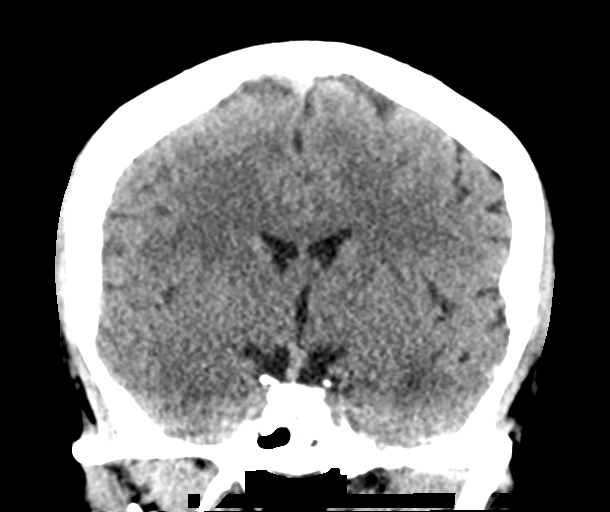

[Series 5: head without sag · sagittal · non-contrast · 0.30mm/px · 3 of 57 slices shown]
[im 19/57  brain]
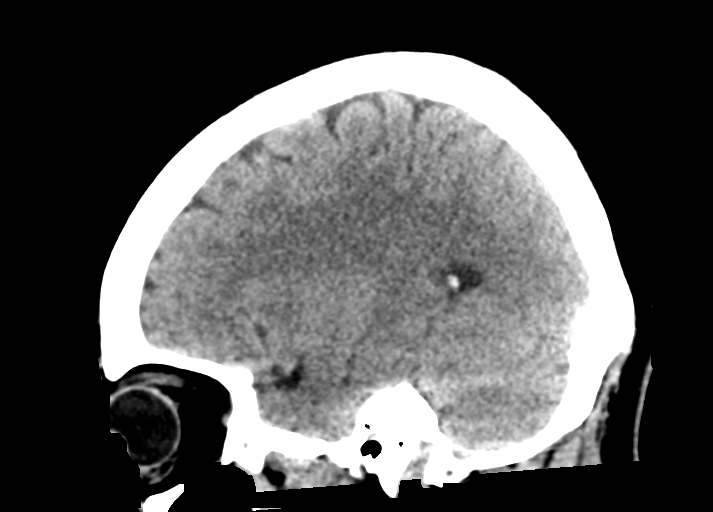
[im 29/57  brain]
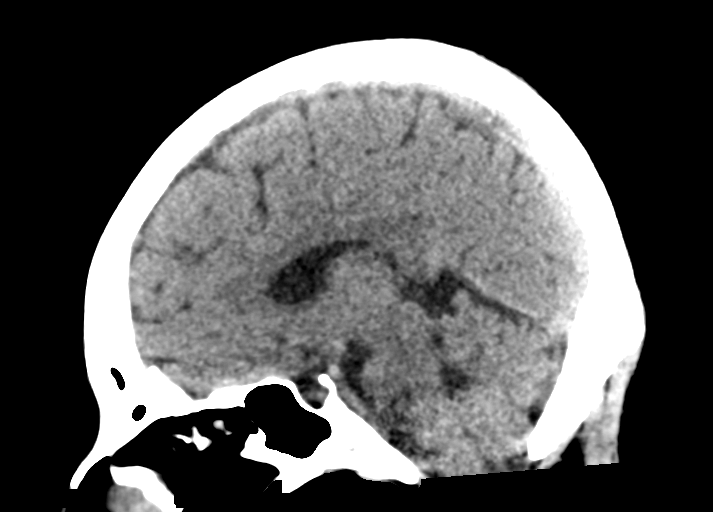
[im 38/57  brain]
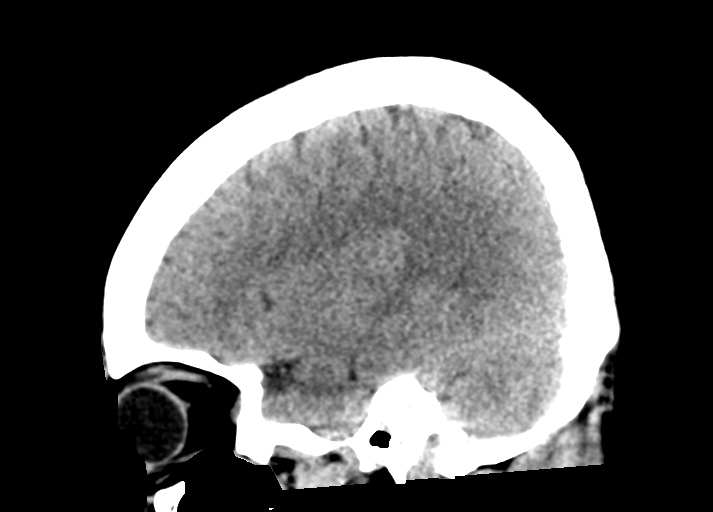

[16 of 47 positions shown; findings below may reference images not displayed]

FINDINGS: Brain: Appears normal without hemorrhage, infarct, mass lesion, mass
effect, midline shift or abnormal extra-axial fluid collection. No
hydrocephalus or pneumocephalus.

Vascular: Unremarkable.

Skull: Intact.

Sinuses/Orbits: Unremarkable.

Other: None.
IMPRESSION: Negative head CT.

## 2018-04-10 ENCOUNTER — Encounter: Payer: Self-pay | Admitting: Family Medicine

## 2018-04-10 ENCOUNTER — Other Ambulatory Visit: Payer: Self-pay

## 2018-04-10 ENCOUNTER — Ambulatory Visit (INDEPENDENT_AMBULATORY_CARE_PROVIDER_SITE_OTHER): Payer: Worker's Compensation | Admitting: Family Medicine

## 2018-04-10 VITALS — BP 130/80 | HR 74 | Temp 98.0°F | Ht 64.17 in | Wt 167.8 lb

## 2018-04-10 DIAGNOSIS — T23072A Burn of unspecified degree of left wrist, initial encounter: Secondary | ICD-10-CM

## 2018-04-10 DIAGNOSIS — T300 Burn of unspecified body region, unspecified degree: Secondary | ICD-10-CM

## 2018-04-10 MED ORDER — SILVER SULFADIAZINE 1 % EX CREA: 1.0000 "application " | TOPICAL_CREAM | Freq: Every day | CUTANEOUS | 1 refills | Status: DC

## 2018-04-10 MED ORDER — CHLORHEXIDINE GLUCONATE 4 % EX LIQD: Freq: Every day | CUTANEOUS | 0 refills | Status: DC | PRN

## 2018-04-10 NOTE — Progress Notes (Signed)
Chief Complaint  Patient presents with  . 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  Current Outpatient Medications  Medication Sig Dispense Refill  . chlorhexidine (HIBICLENS) 4 % external liquid Apply topically daily as needed. 120 mL 0  . silver sulfADIAZINE (SILVADENE) 1 % cream Apply 1 application topically daily. 50 g 1   No current facility-administered medications for this visit.     Allergies: No Known Allergies  No past surgical history on file.  Social History   Socioeconomic History  . Marital status: Married    Spouse name: Not on file  . Number of children: Not on file  . Years of education: Not on file  . Highest education level: Not on file  Occupational History  . Not on file  Social Needs  . Financial resource strain: Not on file  . Food insecurity:    Worry: Not on file    Inability: Not on file  . Transportation needs:    Medical: Not on file    Non-medical: Not on file  Tobacco Use  . Smoking status: Never Smoker  . Smokeless tobacco: Never Used  Substance and Sexual Activity  . Alcohol use: No  . Drug use: No  . Sexual activity: Not on file  Lifestyle  . Physical activity:    Days per week: Not on file    Minutes per session: Not on file  . Stress: Not on file  Relationships  . Social connections:    Talks on phone: Not on file    Gets together: Not on file    Attends religious service: Not on file    Active member of club or organization: Not on file    Attends meetings of clubs or organizations: Not on file    Relationship status: Not on file  Other Topics Concern  . Not on file  Social  History Narrative  . Not on file    No family history on file.   ROS Review of Systems See HPI Constitution: No fevers or chills No malaise No diaphoresis Skin: see hpi Eyes: no blurry vision, no double vision GU: no dysuria or hematuria Neuro: no dizziness or headaches all others reviewed and negative   Objective: Vitals:   04/10/18 1546  BP: 130/80  Pulse: 74  Temp: 98 F (36.7 C)  TempSrc: Oral  SpO2: 98%  Weight: 167 lb 12.8 oz (76.1 kg)  Height: 5' 4.17" (1.63 m)    Physical Exam  Constitutional: She is oriented to person, place, and time. She appears well-developed and well-nourished.  HENT:  Head: Normocephalic and atraumatic.  Eyes: Conjunctivae and EOM are normal.  Cardiovascular: Normal rate, regular rhythm and normal heart sounds.  No murmur heard. Pulmonary/Chest: Effort normal and breath sounds normal. No stridor. No respiratory distress. She has no wheezes.  Neurological: She is alert and oriented to person, place, and time.  Psychiatric: She has a normal mood and affect. Her behavior is normal. Judgment and thought content normal.     Pt with circumferential burn with hyperpigmentation and vesicles  Yellow is from the aloe vera dressings she applied No purulent drainage No erythema Cap refill <3s       Assessment and Plan Caroline Patton was seen today for left forearm burn.  Diagnoses and all orders for this visit:  Skin burn -     chlorhexidine (HIBICLENS) 4 % external liquid; Apply topically daily as needed. -     silver sulfADIAZINE (SILVADENE) 1 % cream; Apply 1 application topically daily. 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

## 2018-04-10 NOTE — Patient Instructions (Addendum)
If you have lab work done today you will be contacted with your lab results within the next 2 weeks.  If you have not heard from us then please contact us. The fastest way to get your results is to register for My Chart.   IF you received an x-ray today, you will receive an invoice from [facility]. Please contact [facility] at [phone] with questions or concerns regarding your invoice.   IF you received labwork today, you will receive an invoice from [facility]. Please contact [facility] at [phone] with questions or concerns regarding your invoice.   Our billing staff will not be able to assist you with questions regarding bills from these companies.  You will be contacted with the lab results as soon as they are available. The fastest way to get your results is to activate your My Chart account. Instructions are located on the last page of this paperwork. If you have not heard from us regarding the results in 2 weeks, please contact this office.      Cuidados de las Whole Foodsquemaduras en los adultos (Burn Care, Adult) Judye BosUna quemadura es una lesin en la piel o de los tejidos que se encuentran debajo de la piel. Hay tres tipos de quemaduras:  Museum/gallery conservatorrimer grado. Estas quemaduras pueden causar enrojecimiento en la piel y un poco de hinchazn.  PublixSegundo grado. Estas quemaduras son Lynnae Sandhoffmuy dolorosas y producen mucho enrojecimiento en la piel. Tambin puede parecer que la piel secreta lquido, tenga un aspecto brillante y se formen ampollas.  Tercer Gae Bongrado. Estas quemaduras pueden causar un TXU Corpdao permanente. La piel se vuelve blanca o negra y puede parecer carbonizada, seca y correosa. El cuidado correcto de las quemaduras puede ayudar a Psychologist, sport and exerciseevitar el dolor y a Radio producerprevenir las infecciones. Adems puede ayudar a que la cicatrizacin sea ms rpida. RIESGOS Y COMPLICACIONES Las complicaciones de una quemadura Bonifayincluyen, entre otras:  Daos en la piel.  Flujo sanguneo reducido cerca de la  lesin.  Tejido muerto.  Formacin de cicatrices.  Problemas con la movilidad, si la quemadura se produjo cerca de una articulacin o en las manos o los pies. Las The Pepsiquemaduras graves pueden generar problemas que afectan a todo el [place]organismo, por ejemplo:  Prdida de lquidos.  Menos sangre circulando en el cuerpo.  Incapacidad para Radiation protection practitionermantener una temperatura corporal normal (termorregulacin).  Una infeccin.  Shock.  Problemas respiratorios. CMO CUIDAR UNA QUEMADURA DE PRIMER GRADO Inmediatamente despus de sufrir la quemadura:  Enjuague o remoje la Lao People's Democratic Republicquemadura con agua fra hasta que pare Chief Technology Officerel dolor. No aplique hielo en la quemadura. Esto puede causarle ms daos.  Maltaubra ligeramente la quemadura con una venda estril (vendaje). Cuidado de las SunTrustquemaduras  Siga las indicaciones del mdico acerca de lo siguiente: ? Cmo limpiar y cuidar la Merrimacquemadura. ? Cundo debe cambiar y retirar el vendaje.  Revsese la Leggett & Plattquemadura todos los das para detectar signos de infeccin. Est atento a los siguientes signos: ? Aumento del enrojecimiento, de la hinchazn o del dolor. ? Calor. ? Pus o mal olor. Medicamentos  Baxter Internationalome los medicamentos de venta libre y los recetados solamente como se lo haya indicado el mdico.  Si le recetaron antibiticos, tmelos o aplqueselos como se lo haya indicado el mdico. No deje de usar el antibitico aunque la afeccin mejore. Instrucciones generales  Para evitar las infecciones, no aplique mantequilla, aceite ni otros remedios caseros Colgatesobre la quemadura.  No frote la quemadura, ni siquiera cuando la est limpiando.  Proteja la Lao People's Democratic Republicquemadura del  sol. CMO CUIDAR UNA QUEMADURA DE SEGUNDO GRADO Inmediatamente despus de sufrir la quemadura:  Enjuague o remoje la quemadura con agua fra. Hgalo durante varios minutos. No aplique hielo en la quemadura. Esto puede causarle ms daos.  [place] ligeramente la quemadura con una venda estril (vendaje). Cuidado de las  [employer] est sentado o acostado, eleve la zona de la lesin por encima del nivel del corazn.  Siga las indicaciones del mdico acerca de lo siguiente: ? Cmo limpiar y Statistician. ? Cundo debe cambiar y retirar el vendaje.  Revsese la [employer] para detectar signos de infeccin. Est atento a los siguientes signos: ? Aumento del enrojecimiento, de la hinchazn o del dolor. ? Calor. ? Pus o mal olor. Medicamentos  [employer] International de venta libre y los recetados solamente como se lo haya indicado el mdico.  Si le recetaron antibiticos, tmelos o aplqueselos como se lo haya indicado el mdico. No deje de usar el antibitico aunque la afeccin mejore. Instrucciones generales  Para prevenir la infeccin: ? No aplique mantequilla, aceite ni otros remedios caseros [employer]gate. ? No se rasque ni se toque la quemadura. ? No rompa ninguna ampolla. ? No se quite las escamas de piel.  No frote la quemadura, ni siquiera cuando la est limpiando.  Proteja la quemadura del sol. CMO CUIDAR UNA QUEMADURA DE TERCER GRADO Inmediatamente despus de sufrir la quemadura:  [place] ligeramente la quemadura con una gasa.  Solicite atencin mdica de inmediato. Cuidado de las [employer] est sentado o acostado, eleve la zona de la lesin por encima del nivel del corazn.  Beba suficiente lquido para Photographer orina clara o de color amarillo plido.  Haga reposo como se lo haya indicado el mdico. No participe en deportes u otras actividades fsicas hasta que el mdico lo autorice.  Siga las indicaciones del mdico acerca de lo siguiente: ? Cmo limpiar y Statistician. ? Cundo debe cambiar y retirar el vendaje.  Revsese la [employer] para detectar signos de infeccin. Est atento a los siguientes signos: ? Aumento del enrojecimiento, de la hinchazn o del dolor. ? Calor. ? Pus o mal olor. Medicamentos  [employer] de venta libre y los recetados solamente como se lo haya indicado el mdico.  Si le recetaron antibiticos, tmelos o aplqueselos como se lo haya indicado el mdico. No deje de usar el antibitico aunque la afeccin mejore. Instrucciones generales  Para prevenir la infeccin: ? No aplique mantequilla, aceite ni otros remedios caseros [employer]gate. ? No se rasque ni se toque la quemadura. ? No rompa ninguna ampolla. ? No se quite las escamas de piel. ? No frote la quemadura, ni siquiera cuando la est limpiando.  Proteja la quemadura del sol.  Concurra a todas las visitas de control como se lo haya indicado el mdico. Esto es importante. SOLICITE ATENCIN MDICA SI:  La afeccin no mejora.  La afeccin empeora.  Tiene fiebre.  La quemadura cambia de aspecto o aparecen manchas negras o rojas.  La quemadura est caliente al tacto.  El dolor no se alivia con los [employer]. SOLICITE ATENCIN MDICA DE INMEDIATO SI:  Tiene enrojecimiento, hinchazn o dolor en la zona de la quemadura.  Observa lquido, sangre o pus que sale de la [place].  Tiene lneas rojas cerca de la [place].  Siente dolor intenso. Esta informacin no tiene Theme park manager el consejo del mdico. Asegrese de hacerle  al mdico cualquier pregunta que tenga. Document Released: 07/31/2005 Document Revised: 11/22/2015 Document Reviewed: 01/18/2016 Elsevier Interactive Patient Education  2018 [employer].

## 2018-11-05 ENCOUNTER — Emergency Department (HOSPITAL_COMMUNITY)
Admission: EM | Admit: 2018-11-05 | Discharge: 2018-11-05 | Disposition: A | Discharge status: Home / Self Care | Payer: No Typology Code available for payment source | Attending: Emergency Medicine | Admitting: Emergency Medicine

## 2018-11-05 ENCOUNTER — Other Ambulatory Visit: Payer: Self-pay

## 2018-11-05 ENCOUNTER — Encounter (HOSPITAL_COMMUNITY): Payer: Self-pay | Admitting: Emergency Medicine

## 2018-11-05 DIAGNOSIS — R112 Nausea with vomiting, unspecified: Secondary | ICD-10-CM | POA: Insufficient documentation

## 2018-11-05 DIAGNOSIS — H53149 Visual discomfort, unspecified: Secondary | ICD-10-CM | POA: Insufficient documentation

## 2018-11-05 DIAGNOSIS — R51 Headache: Secondary | ICD-10-CM | POA: Insufficient documentation

## 2018-11-05 DIAGNOSIS — I10 Essential (primary) hypertension: Secondary | ICD-10-CM | POA: Insufficient documentation

## 2018-11-05 DIAGNOSIS — R519 Headache, unspecified: Secondary | ICD-10-CM

## 2018-11-05 HISTORY — DX: Essential (primary) hypertension: I10

## 2018-11-05 LAB — BASIC METABOLIC PANEL
Anion gap: 9 (ref 5–15)
BUN: <5 mg/dL — ABNORMAL LOW (ref 6–20)
CO2: 23 mmol/L (ref 22–32)
Calcium: 8.7 mg/dL — ABNORMAL LOW (ref 8.9–10.3)
Chloride: 101 mmol/L (ref 98–111)
Creatinine, Ser: 0.53 mg/dL (ref 0.44–1.00)
GFR calc Af Amer: >60 mL/min (ref >60)
GFR calc non Af Amer: >60 mL/min (ref >60)
Glucose, Bld: 152 mg/dL — ABNORMAL HIGH (ref 70–99)
Potassium: 4.4 mmol/L (ref 3.5–5.1)
Sodium: 133 mmol/L — ABNORMAL LOW (ref 135–145)

## 2018-11-05 LAB — CBC WITH DIFFERENTIAL/PLATELET
ABS IMMATURE GRANULOCYTES: 0.04 10*3/uL (ref 0.00–0.07)
Basophils Absolute: 0 10*3/uL (ref 0.0–0.1)
Basophils Relative: 0 %
Eosinophils Absolute: 0 10*3/uL (ref 0.0–0.5)
Eosinophils Relative: 0 %
HCT: 42.1 % (ref 36.0–46.0)
Hemoglobin: 13.6 g/dL (ref 12.0–15.0)
IMMATURE GRANULOCYTES: 0 %
LYMPHS ABS: 0.3 10*3/uL — AB (ref 0.7–4.0)
LYMPHS PCT: 3 %
MCH: 27.1 pg (ref 26.0–34.0)
MCHC: 32.3 g/dL (ref 30.0–36.0)
MCV: 84 fL (ref 80.0–100.0)
Monocytes Absolute: 0.1 10*3/uL (ref 0.1–1.0)
Monocytes Relative: 1 %
Neutro Abs: 8.7 10*3/uL — ABNORMAL HIGH (ref 1.7–7.7)
Neutrophils Relative %: 96 %
Platelets: 248 10*3/uL (ref 150–400)
RBC: 5.01 MIL/uL (ref 3.87–5.11)
RDW: 12.9 % (ref 11.5–15.5)
WBC: 9.1 10*3/uL (ref 4.0–10.5)
nRBC: 0 % (ref 0.0–0.2)

## 2018-11-05 MED ORDER — KETOROLAC TROMETHAMINE 30 MG/ML IJ SOLN
30.0000 mg | Freq: Once | INTRAMUSCULAR | Status: AC
  Administered 2018-11-05: 30 mg via INTRAMUSCULAR
  Filled 2018-11-05: qty 1

## 2018-11-05 MED ORDER — DIPHENHYDRAMINE HCL 50 MG/ML IJ SOLN
12.5000 mg | Freq: Once | INTRAMUSCULAR | Status: AC
  Administered 2018-11-05: 12.5 mg via INTRAVENOUS
  Filled 2018-11-05: qty 1

## 2018-11-05 MED ORDER — SODIUM CHLORIDE 0.9 % IV BOLUS
1000.0000 mL | Freq: Once | INTRAVENOUS | Status: AC
  Administered 2018-11-05: 1000 mL via INTRAVENOUS

## 2018-11-05 MED ORDER — METOCLOPRAMIDE HCL 5 MG/ML IJ SOLN
10.0000 mg | Freq: Once | INTRAMUSCULAR | Status: AC
  Administered 2018-11-05: 10 mg via INTRAVENOUS
  Filled 2018-11-05: qty 2

## 2018-11-05 NOTE — ED Triage Notes (Signed)
Pt complains of headache/migrane. Pt states she also is having high blood pressure. Pt states she has vomittted this morning also.

## 2018-11-05 NOTE — Discharge Instructions (Addendum)
You were seen in the hospital for a headache that began this morning.  You received some pain medications that helped her headache.  It will be important for you to follow-up with your primary care doctor.  Please return if any worsening symptoms.

## 2018-11-05 NOTE — ED Notes (Signed)
Patient verbalizes understanding of discharge instructions. Opportunity for questioning and answers were provided. Armband removed by staff, pt discharged from ED ambulatory to home.  

## 2018-11-05 NOTE — ED Provider Notes (Signed)
[facility] EMERGENCY DEPARTMENT Provider Note   CSN: [id number] Arrival date & time: 11/05/18  1612    History   Chief Complaint Chief Complaint  Patient presents with  . Headache    HPI Keyunna Aburto-Hernandez is a 48 y.o. female.  She is primarily Spanish-speaking and interview was done with iPad interpreter.  She is complaining of waking up with a throbbing headache that she calls her migraine.  Is associated with nausea and vomiting.  No blurry vision double vision chest pain shortness of breath.  No numbness or weakness.  She has had headaches like this before although not in a while.  No recent trauma no fever no sick contacts no recent travel.  She tried her blood pressure medicine and that did not improve her headache.     The history is provided by the patient. The history is limited by a language barrier. A language interpreter was used.  Headache  Pain location:  Generalized Quality:  Unable to specify Radiates to:  Does not radiate Onset quality:  Gradual Timing:  Constant Progression:  Unchanged Chronicity:  Recurrent Similar to prior headaches: yes   Context: bright light   Relieved by:  Nothing Worsened by:  Light and sound Ineffective treatments:  Prescription medications Associated symptoms: nausea, photophobia and vomiting   Associated symptoms: no abdominal pain, no back pain, no blurred vision, no cough, no diarrhea, no dizziness, no fever, no focal weakness, no hearing loss, no loss of balance, no neck stiffness, no sore throat and no visual change     Past Medical History:  Diagnosis Date  . Hypertension     There are no active problems to display for this patient.   History reviewed. No pertinent surgical history.   OB History   No obstetric history on file.      Home Medications    Prior to Admission medications   Medication Sig Start Date End Date Taking? Authorizing Provider  chlorhexidine (HIBICLENS) 4 % external  liquid Apply topically daily as needed. 04/10/18   Doristine Bosworth, MD  silver sulfADIAZINE (SILVADENE) 1 % cream Apply 1 application topically daily. 04/10/18   Doristine Bosworth, MD    Family History No family history on file.  Social History Social History   Tobacco Use  . Smoking status: Never Smoker  . Smokeless tobacco: Never Used  Substance Use Topics  . Alcohol use: No  . Drug use: No     Allergies   Patient has no known allergies.   Review of Systems Review of Systems  Constitutional: Negative for fever.  HENT: Negative for hearing loss and sore throat.   Eyes: Positive for photophobia. Negative for blurred vision.  Respiratory: Negative for cough and shortness of breath.   Cardiovascular: Negative for chest pain.  Gastrointestinal: Positive for nausea and vomiting. Negative for abdominal pain and diarrhea.  Genitourinary: Negative for dysuria.  Musculoskeletal: Negative for back pain and neck stiffness.  Skin: Negative for rash.  Neurological: Positive for headaches. Negative for dizziness, focal weakness and loss of balance.     Physical Exam Updated Vital Signs BP (!) 173/105 (BP Location: Right Arm)   Pulse 87   Temp 97.9 F (36.6 C) (Oral)   Resp 16   Ht 5\' 5"  (1.651 m)   Wt 76.7 kg   SpO2 100%   BMI 28.12 kg/m   Physical Exam Vitals signs and nursing note reviewed.  Constitutional:      General: She is  not in acute distress.    Appearance: She is well-developed.  HENT:     Head: Normocephalic and atraumatic.  Eyes:     Conjunctiva/sclera: Conjunctivae normal.  Neck:     Musculoskeletal: Neck supple. No neck rigidity.  Cardiovascular:     Rate and Rhythm: Normal rate and regular rhythm.     Heart sounds: No murmur.  Pulmonary:     Effort: Pulmonary effort is normal. No respiratory distress.     Breath sounds: Normal breath sounds.  Abdominal:     Palpations: Abdomen is soft.     Tenderness: There is no abdominal tenderness.   Musculoskeletal: Normal range of motion.        General: No tenderness.     Right lower leg: No edema.     Left lower leg: No edema.  Skin:    General: Skin is warm and dry.     Capillary Refill: Capillary refill takes less than 2 seconds.  Neurological:     General: No focal deficit present.     Mental Status: She is alert and oriented to person, place, and time.     Sensory: No sensory deficit.     Motor: No weakness.     Gait: Gait normal.      ED Treatments / Results  Labs (all labs ordered are listed, but only abnormal results are displayed) Labs Reviewed  BASIC METABOLIC PANEL - Abnormal; Notable for the following components:      Result Value   Sodium 133 (*)    Glucose, Bld 152 (*)    BUN <5 (*)    Calcium 8.7 (*)    All other components within normal limits  CBC WITH DIFFERENTIAL/PLATELET - Abnormal; Notable for the following components:   Neutro Abs 8.7 (*)    Lymphs Abs 0.3 (*)    All other components within normal limits    EKG None  Radiology No results found.  Procedures Procedures (including critical care time)  Medications Ordered in ED Medications  metoCLOPramide (REGLAN) injection 10 mg (has no administration in time range)  ketorolac (TORADOL) 30 MG/ML injection 30 mg (has no administration in time range)  sodium chloride 0.9 % bolus 1,000 mL (has no administration in time range)  diphenhydrAMINE (BENADRYL) injection 12.5 mg (has no administration in time range)     Initial Impression / Assessment and Plan / ED Course  I have reviewed the triage vital signs and the nursing notes.  Pertinent labs & imaging results that were available during my care of the patient were reviewed by me and considered in my medical decision making (see chart for details).  Clinical Course as of Nov 05 1200  Tue Nov 05, 2018  [id number] 48 year old female with history of hypertension here with headache since this morning.  She said she has had headaches like this  before which she calls migraines.  She is a nonfocal neuro exam here.  She is hypertensive and took her blood pressure medicine this morning.  We are giving her a migraine cocktail and checking some basic labs.  Differential includes migraine, generalized headache, hypertensive urgency, subarachnoid, intracranial hemorrhage.  She has had headaches like this before I think she does require CNS imaging at this time.   [MB]  1931 Patient's headache improved.  Will discharge and have follow-up with PCP.   [MB]    Clinical Course User Index [MB] Terrilee Files, MD        Final Clinical Impressions(s) / ED  Diagnoses   Final diagnoses:  Acute nonintractable headache, unspecified headache type    ED Discharge Orders    None       Terrilee Files, MD 11/06/18 1203

## 2019-06-09 ENCOUNTER — Telehealth: Payer: Self-pay

## 2019-06-09 NOTE — Telephone Encounter (Signed)

## 2019-06-10 ENCOUNTER — Other Ambulatory Visit: Payer: Self-pay

## 2019-06-10 ENCOUNTER — Ambulatory Visit (INDEPENDENT_AMBULATORY_CARE_PROVIDER_SITE_OTHER): Payer: Managed Care, Other (non HMO) | Admitting: Internal Medicine

## 2019-06-10 ENCOUNTER — Encounter: Payer: Self-pay | Admitting: Internal Medicine

## 2019-06-10 VITALS — BP 156/99 | HR 70 | Temp 97.3°F | Resp 17 | Ht 66.0 in | Wt 178.8 lb

## 2019-06-10 DIAGNOSIS — E663 Overweight: Secondary | ICD-10-CM | POA: Diagnosis not present

## 2019-06-10 DIAGNOSIS — F4321 Adjustment disorder with depressed mood: Secondary | ICD-10-CM

## 2019-06-10 DIAGNOSIS — F320 Major depressive disorder, single episode, mild: Secondary | ICD-10-CM | POA: Diagnosis not present

## 2019-06-10 DIAGNOSIS — I10 Essential (primary) hypertension: Secondary | ICD-10-CM | POA: Diagnosis not present

## 2019-06-10 DIAGNOSIS — Z23 Encounter for immunization: Secondary | ICD-10-CM

## 2019-06-10 MED ORDER — ESCITALOPRAM OXALATE 10 MG PO TABS: ORAL_TABLET | ORAL | 1 refills | Status: DC

## 2019-06-10 MED ORDER — AMLODIPINE BESYLATE 5 MG PO TABS: 5.0000 mg | ORAL_TABLET | Freq: Every day | ORAL | 1 refills | Status: AC

## 2019-06-10 MED ORDER — LISINOPRIL 20 MG PO TABS: 20.0000 mg | ORAL_TABLET | Freq: Every day | ORAL | 1 refills | Status: AC

## 2019-06-10 NOTE — Progress Notes (Signed)
Here to get established.  Doesn't check BP at home. Denies chest pain, SHOB, vision changes, dizziness, lower extremity swelling. Has occasional headache.  Will get flu shot today.  Has elevated PHQ9. Doesn't seem interested in medication but would like to talk with Education officer, museum. States that her son died 21 months ago in an accident & she's had issues with depression since.

## 2019-06-10 NOTE — Progress Notes (Signed)
Patient ID: Caroline Patton, female    DOB: [birth year]  MRN: [id number]  CC: Establish Care and Hypertension   Subjective: Caroline Patton is a 48 y.o. female who presents for new pt visit at [facility].  Dorita from [place] is with her and interprets. Her concerns today include:  No previous PCP. Hx of HTN on Lisinopril which was being prescribed through another clinic.  Taking consistently x 9 mths.  Took already for today. No device to check BP Limits salt in foods No chest pains or shortness of breath.  No lower extremity edema.  Positive depression and GAD-7 today:  40 yr old son died suddenly 9 mths ago.  Feels she would benefit from some counseling. Not sure if she would benefit from medication but willing to try to see if it would help her feel better.  She endorses problems sleeping.  Appetite remains good.  Reports good family support.  Last pap and MMG were 2 yrs ago at [facility]   Past medical, social, family history and surgical histories reviewed. Current Outpatient Medications on File Prior to Visit  Medication Sig Dispense Refill  . lisinopril (ZESTRIL) 20 MG tablet Take 20 mg by mouth daily.     No current facility-administered medications on file prior to visit.     No Known Allergies  Social History   Socioeconomic History  . Marital status: Married    Spouse name: Not on file  . Number of children: Not on file  . Years of education: Not on file  . Highest education level: Not on file  Occupational History  . Not on file  Social Needs  . Financial resource strain: Not on file  . Food insecurity    Worry: Not on file    Inability: Not on file  . Transportation needs    Medical: Not on file    Non-medical: Not on file  Tobacco Use  . Smoking status: Never Smoker  . Smokeless tobacco: Never Used  Substance and Sexual Activity  . Alcohol use: No  . Drug use: No  . Sexual activity: Not on file  Lifestyle  . Physical activity     Days per week: Not on file    Minutes per session: Not on file  . Stress: Not on file  Relationships  . Social Musician on phone: Not on file    Gets together: Not on file    Attends religious service: Not on file    Active member of club or organization: Not on file    Attends meetings of clubs or organizations: Not on file    Relationship status: Not on file  . Intimate partner violence    Fear of current or ex partner: Not on file    Emotionally abused: Not on file    Physically abused: Not on file    Forced sexual activity: Not on file  Other Topics Concern  . Not on file  Social History Narrative  . Not on file    Family History  Problem Relation Age of Onset  . Early death Son     Past Surgical History:  Procedure Laterality Date  . NO PAST SURGERIES      ROS: Review of Systems Negative except as stated above  PHYSICAL EXAM: BP (!) 146/92   Pulse 70   Temp (!) 97.3 F (36.3 C) (Temporal)   Resp 17   Ht 5\' 6"  (1.676 m)   Wt 178  lb 12.8 oz (81.1 kg)   LMP 05/11/2019 (Exact Date)   SpO2 95%   BMI 28.86 kg/m   Physical Exam  General appearance - alert, well appearing, and in no distress Mental status - normal mood, behavior, speech, dress, motor activity, and thought processes Eyes -small pterygium noted medially in both eyes.  Pink conjunctiva Nose - normal and patent, no erythema, discharge or polyps Mouth - mucous membranes moist, pharynx normal without lesions Neck - supple, no significant adenopathy Chest - clear to auscultation, no wheezes, rales or rhonchi, symmetric air entry Heart - normal rate, regular rhythm, normal S1, S2, no murmurs, rubs, clicks or gallops Extremities - peripheral pulses normal, no pedal edema, no clubbing or cyanosis  Depression screen [facility] 2/9 06/10/2019 04/10/2018 04/19/2016  Decreased Interest 3 0 2  Down, Depressed, Hopeless 3 0 1  PHQ - 2 Score 6 0 3  Altered sleeping 2 - 3  Tired, decreased energy 2 - 2   Change in appetite 3 - 1  Feeling bad or failure about yourself  2 - 1  Trouble concentrating 3 - 0  Moving slowly or fidgety/restless 1 - 0  Suicidal thoughts 1 - 0  PHQ-9 Score 20 - 10  Difficult doing work/chores Extremely dIfficult - Not difficult at all   GAD 7 : Generalized Anxiety Score 06/10/2019  Nervous, Anxious, on Edge 3  Control/stop worrying 3  Worry too much - different things 3  Trouble relaxing 3  Restless 1  Easily annoyed or irritable 3  Afraid - awful might happen 3  Total GAD 7 Score 19  Anxiety Difficulty Extremely difficult       CMP Latest Ref Rng & Units 11/05/2018 04/16/2016 07/04/2012  Glucose 70 - 99 mg/dL 152(H) 132(H) 144(H)  BUN 6 - 20 mg/dL <5(L) 6 6  Creatinine 0.44 - 1.00 mg/dL 0.53 0.30(L) 0.35(L)  Sodium 135 - 145 mmol/L 133(L) 138 130(L)  Potassium 3.5 - 5.1 mmol/L 4.4 3.6 3.2(L)  Chloride 98 - 111 mmol/L 101 104 96  CO2 22 - 32 mmol/L 23 - 22  Calcium 8.9 - 10.3 mg/dL 8.7(L) - 8.6  Total Protein 6.0 - 8.3 g/dL - - 7.7  Total Bilirubin 0.3 - 1.2 mg/dL - - 0.2(L)  Alkaline Phos 39 - 117 U/L - - 90  AST 0 - 37 U/L - - 19  ALT 0 - 35 U/L - - 18   Lipid Panel  No results found for: CHOL, TRIG, HDL, CHOLHDL, VLDL, LDLCALC, LDLDIRECT  CBC    Component Value Date/Time   WBC 9.1 11/05/2018 1709   RBC 5.01 11/05/2018 1709   HGB 13.6 11/05/2018 1709   HCT 42.1 11/05/2018 1709   PLT 248 11/05/2018 1709   MCV 84.0 11/05/2018 1709   MCH 27.1 11/05/2018 1709   MCHC 32.3 11/05/2018 1709   RDW 12.9 11/05/2018 1709   LYMPHSABS 0.3 (L) 11/05/2018 1709   MONOABS 0.1 11/05/2018 1709   EOSABS 0.0 11/05/2018 1709   BASOSABS 0.0 11/05/2018 1709    ASSESSMENT AND PLAN: 1. Essential hypertension Not at goal.  Continue lisinopril.  Add low-dose amlodipine.  DASH diet discussed and encouraged. - Comprehensive metabolic panel - Lipid panel - lisinopril (ZESTRIL) 20 MG tablet; Take 1 tablet (20 mg total) by mouth daily.  Dispense: 90 tablet;  Refill: 1 - amLODipine (NORVASC) 5 MG tablet; Take 1 tablet (5 mg total) by mouth daily.  Dispense: 90 tablet; Refill: 1  2. Grief reaction Patient agreeable to  meeting with or talking with our LCSW.  She is not here today.  I have sent her a message to touch base with the patient.  3. Need for influenza vaccination Given  4. Current mild episode of major depressive disorder without prior episode [facility](HCC) Patient is agreeable to trying low-dose of Lexapro.  Advised patient that it can take 4 to 6 weeks before she starts to feel better on the medicine.  If she has any increased depression or suicidal ideation while on the medicine she should come in.  Follow-up in 6 weeks to see how she is doing on the medicine. - escitalopram (LEXAPRO) 10 MG tablet; 1/2 tab daily for 2 wks then 1 tab PO daily  Dispense: 30 tablet; Refill: 1  5. Over weight - Hemoglobin A1c    Patient was given the opportunity to ask questions.  Patient verbalized understanding of the plan and was able to repeat key elements of the plan.   No orders of the defined types were placed in this encounter.    Requested Prescriptions    No prescriptions requested or ordered in this encounter    No follow-ups on file.  Jonah Blueeborah Faruq Rosenberger, MD, FACP

## 2019-06-11 ENCOUNTER — Encounter: Payer: Self-pay | Admitting: Internal Medicine

## 2019-06-11 DIAGNOSIS — R7303 Prediabetes: Secondary | ICD-10-CM | POA: Insufficient documentation

## 2019-06-11 LAB — COMPREHENSIVE METABOLIC PANEL
ALT: 18 IU/L (ref 0–32)
AST: 19 IU/L (ref 0–40)
Albumin/Globulin Ratio: 1.5 (ref 1.2–2.2)
Albumin: 4.3 g/dL (ref 3.8–4.8)
Alkaline Phosphatase: 86 IU/L (ref 39–117)
BUN/Creatinine Ratio: 18 (ref 9–23)
BUN: 10 mg/dL (ref 6–24)
Bilirubin Total: <0.2 mg/dL (ref 0.0–1.2)
CO2: 24 mmol/L (ref 20–29)
Calcium: 9.2 mg/dL (ref 8.7–10.2)
Chloride: 105 mmol/L (ref 96–106)
Creatinine, Ser: 0.55 mg/dL — ABNORMAL LOW (ref 0.57–1.00)
GFR calc Af Amer: 129 mL/min/{1.73_m2} (ref >59)
GFR calc non Af Amer: 112 mL/min/{1.73_m2} (ref >59)
Globulin, Total: 2.8 g/dL (ref 1.5–4.5)
Glucose: 89 mg/dL (ref 65–99)
Potassium: 3.9 mmol/L (ref 3.5–5.2)
Sodium: 140 mmol/L (ref 134–144)
Total Protein: 7.1 g/dL (ref 6.0–8.5)

## 2019-06-11 LAB — LIPID PANEL
Chol/HDL Ratio: 2 ratio (ref 0.0–4.4)
Cholesterol, Total: 153 mg/dL (ref 100–199)
HDL: 76 mg/dL (ref >39)
LDL Chol Calc (NIH): 52 mg/dL (ref 0–99)
Triglycerides: 148 mg/dL (ref 0–149)
VLDL Cholesterol Cal: 25 mg/dL (ref 5–40)

## 2019-06-11 LAB — HEMOGLOBIN A1C
Est. average glucose Bld gHb Est-mCnc: 120 mg/dL
Hgb A1c MFr Bld: 5.8 % — ABNORMAL HIGH (ref 4.8–5.6)

## 2019-06-12 ENCOUNTER — Telehealth: Payer: Self-pay

## 2019-06-12 NOTE — Telephone Encounter (Signed)
Please reach out to patient to discuss depression/anxiety since son's death 9 months ago. No prior history of depression or anxiety.

## 2019-06-20 ENCOUNTER — Telehealth: Payer: Self-pay | Admitting: Licensed Clinical Social Worker

## 2019-06-20 NOTE — Telephone Encounter (Signed)
Call placed to patient utilizing [place] [employer] [phone]) LCSW introduced self and explained role at [facility]. Pt was informed of consult from PCP to assist with grief.   Pt shared that she has been feeling better and is taking Lexapro to assist with management of depression symptoms. LCSW offered grief support resources and encouraged pt to contact office with any additional behavioral health and/or resource needs.

## 2019-07-28 ENCOUNTER — Telehealth: Payer: Self-pay

## 2019-07-28 NOTE — Telephone Encounter (Signed)
Called patient to do their pre-visit COVID screening.  Call went to voicemail. Unable to do prescreening.  

## 2019-07-29 ENCOUNTER — Ambulatory Visit (INDEPENDENT_AMBULATORY_CARE_PROVIDER_SITE_OTHER): Payer: Managed Care, Other (non HMO) | Admitting: Internal Medicine

## 2019-07-29 ENCOUNTER — Other Ambulatory Visit: Payer: Self-pay

## 2019-07-29 VITALS — BP 122/82 | HR 78 | Temp 97.3°F | Resp 17 | Wt 185.0 lb

## 2019-07-29 DIAGNOSIS — F320 Major depressive disorder, single episode, mild: Secondary | ICD-10-CM | POA: Diagnosis not present

## 2019-07-29 DIAGNOSIS — R7303 Prediabetes: Secondary | ICD-10-CM | POA: Diagnosis not present

## 2019-07-29 DIAGNOSIS — I10 Essential (primary) hypertension: Secondary | ICD-10-CM

## 2019-07-29 DIAGNOSIS — G43009 Migraine without aura, not intractable, without status migrainosus: Secondary | ICD-10-CM | POA: Insufficient documentation

## 2019-07-29 MED ORDER — TOPIRAMATE 25 MG PO TABS: 25.0000 mg | ORAL_TABLET | Freq: Every day | ORAL | 2 refills | Status: AC

## 2019-07-29 MED ORDER — ESCITALOPRAM OXALATE 20 MG PO TABS: 20.0000 mg | ORAL_TABLET | Freq: Every day | ORAL | 3 refills | Status: AC

## 2019-07-29 MED ORDER — SUMATRIPTAN SUCCINATE 50 MG PO TABS: ORAL_TABLET | ORAL | 2 refills | Status: AC

## 2019-07-29 NOTE — Progress Notes (Signed)
Patient ID: Caroline Patton, female    DOB: [birth year]  MRN: [id number]  CC: Depression (has noticed an improvement in depression since starting the Lexapro) and Hypertension (denies chest pain, SHOB, palpitations, lower extremity swelling, dizziness. has some headaches.)   Subjective: Caroline Patton is a 48 y.o. female who presents for f/u [facility] Her concerns today include:  History of essential hypertension, depression, pre-DM  HTN:  Norvasc added to Lisinopril.  She has meds with her today. Reports compliance with meds.  No device to check BP No CP/SOB, LE edema. Endorses some headaches.  Feels it is related to HTN. Wants to know if she can take Ibuprofen or Tylenol Headaches occur about 3 x a wk. Frontal in location. Can last 1-2 hrs if she does not take Tylenol. HA associated with blurred vision, photophobia, N/V.  Comes on suddenly with stabbing sensation in forehead. Ibuprofen helps better than Tylenol.  HA starts going away after 5-7 mins; completely gone after 1 hr of taking Ibuprofen.  She has not identified any triggering factors. -hx of similar HA x 4 yrs.  Dx with migraine by neurologist.  She states that she the neurologist had prescribed some pills for her and she used to get injections in the head.  She is not sure whether it is Botox that she was receiving. -HA were more frequent before being on depression med  Depression:  feeling better on Lexapro. Thinks she would benefit from higher dose.  I went over lab results from recent blood test.  Kidney and LFTs okay.  Lipid profile good.  She has pre-DM Patient Active Problem List   Diagnosis Date Noted  . Prediabetes 06/11/2019  . Grief reaction 06/10/2019  . Essential hypertension 06/10/2019  . Current mild episode of major depressive disorder without prior episode (HCC) 06/10/2019     Current Outpatient Medications on File Prior to Visit  Medication Sig Dispense Refill  . amLODipine (NORVASC) 5  MG tablet Take 1 tablet (5 mg total) by mouth daily. 90 tablet 1  . escitalopram (LEXAPRO) 10 MG tablet 1/2 tab daily for 2 wks then 1 tab PO daily 30 tablet 1  . lisinopril (ZESTRIL) 20 MG tablet Take 1 tablet (20 mg total) by mouth daily. 90 tablet 1   No current facility-administered medications on file prior to visit.    No Known Allergies  Social History   Socioeconomic History  . Marital status: Married    Spouse name: Not on file  . Number of children: 4  . Years of education: Not on file  . Highest education level: Not on file  Occupational History  . Not on file  Tobacco Use  . Smoking status: Never Smoker  . Smokeless tobacco: Never Used  Substance and Sexual Activity  . Alcohol use: No  . Drug use: No  . Sexual activity: Not on file  Other Topics Concern  . Not on file  Social History Narrative  . Not on file   Social Determinants of Health   Financial Resource Strain:   . Difficulty of Paying Living Expenses: Not on file  Food Insecurity:   . Worried About Programme researcher, broadcasting/film/video in the Last Year: Not on file  . Ran Out of Food in the Last Year: Not on file  Transportation Needs:   . Lack of Transportation (Medical): Not on file  . Lack of Transportation (Non-Medical): Not on file  Physical Activity:   . Days of Exercise per Week: Not  on file  . Minutes of Exercise per Session: Not on file  Stress:   . Feeling of Stress : Not on file  Social Connections:   . Frequency of Communication with Friends and Family: Not on file  . Frequency of Social Gatherings with Friends and Family: Not on file  . Attends Religious Services: Not on file  . Active Member of Clubs or Organizations: Not on file  . Attends Archivist Meetings: Not on file  . Marital Status: Not on file  Intimate Partner Violence:   . Fear of Current or Ex-Partner: Not on file  . Emotionally Abused: Not on file  . Physically Abused: Not on file  . Sexually Abused: Not on file     Family History  Problem Relation Age of Onset  . Early death Son     Past Surgical History:  Procedure Laterality Date  . NO PAST SURGERIES      ROS: Review of Systems Negative except as stated above  PHYSICAL EXAM: BP 122/82   Pulse 78   Temp (!) 97.3 F (36.3 C) (Temporal)   Resp 17   Wt 185 lb (83.9 kg)   SpO2 97%   BMI 29.86 kg/m   Wt Readings from Last 3 Encounters:  07/29/19 185 lb (83.9 kg)  06/10/19 178 lb 12.8 oz (81.1 kg)  11/05/18 169 lb (76.7 kg)    Physical Exam  General appearance - alert, well appearing, and in no distress Mental status - normal mood, behavior, speech, dress, motor activity, and thought processes Chest - clear to auscultation, no wheezes, rales or rhonchi, symmetric air entry Heart - normal rate, regular rhythm, normal S1, S2, no murmurs, rubs, clicks or gallops Neurological - cranial nerves II through XII intact, motor and sensory grossly normal bilaterally, Romberg sign negative, normal gait and station   CMP Latest Ref Rng & Units 06/10/2019 11/05/2018 04/16/2016  Glucose 65 - 99 mg/dL 89 152(H) 132(H)  BUN 6 - 24 mg/dL 10 <5(L) 6  Creatinine 0.57 - 1.00 mg/dL 0.55(L) 0.53 0.30(L)  Sodium 134 - 144 mmol/L 140 133(L) 138  Potassium 3.5 - 5.2 mmol/L 3.9 4.4 3.6  Chloride 96 - 106 mmol/L 105 101 104  CO2 20 - 29 mmol/L 24 23 -  Calcium 8.7 - 10.2 mg/dL 9.2 8.7(L) -  Total Protein 6.0 - 8.5 g/dL 7.1 - -  Total Bilirubin 0.0 - 1.2 mg/dL <0.2 - -  Alkaline Phos 39 - 117 IU/L 86 - -  AST 0 - 40 IU/L 19 - -  ALT 0 - 32 IU/L 18 - -   Lipid Panel     Component Value Date/Time   CHOL 153 06/10/2019 1618   TRIG 148 06/10/2019 1618   HDL 76 06/10/2019 1618   CHOLHDL 2.0 06/10/2019 1618   LDLCALC 52 06/10/2019 1618    CBC    Component Value Date/Time   WBC 9.1 11/05/2018 1709   RBC 5.01 11/05/2018 1709   HGB 13.6 11/05/2018 1709   HCT 42.1 11/05/2018 1709   PLT 248 11/05/2018 1709   MCV 84.0 11/05/2018 1709   MCH 27.1  11/05/2018 1709   MCHC 32.3 11/05/2018 1709   RDW 12.9 11/05/2018 1709   LYMPHSABS 0.3 (L) 11/05/2018 1709   MONOABS 0.1 11/05/2018 1709   EOSABS 0.0 11/05/2018 1709   BASOSABS 0.0 11/05/2018 1709   Depression screen PHQ 2/9 06/10/2019 04/10/2018 04/19/2016  Decreased Interest 3 0 2  Down, Depressed, Hopeless 3 0 1  PHQ - 2 Score 6 0 3  Altered sleeping 2 - 3  Tired, decreased energy 2 - 2  Change in appetite 3 - 1  Feeling bad or failure about yourself  2 - 1  Trouble concentrating 3 - 0  Moving slowly or fidgety/restless 1 - 0  Suicidal thoughts 1 - 0  PHQ-9 Score 20 - 10  Difficult doing work/chores Extremely dIfficult - Not difficult at all    ASSESSMENT AND PLAN: 1. Current mild episode of major depressive disorder without prior episode (HCC) Improved.  Patient feels she would benefit from higher dose of Lexapro.  We have increased the dose to 20 mg.  Plan would be to keep her on it for at least 6 months after symptoms completely remit.  Advised to avoid stopping the medication abruptly - escitalopram (LEXAPRO) 20 MG tablet; Take 1 tablet (20 mg total) by mouth daily.  Dispense: 30 tablet; Refill: 3  2. Essential hypertension Close to goal.  Continue current medications  3. Prediabetes Dietary counseling given. Encourage moderate intensity exercise at least 3 to 4 days a week  4. Migraine without aura and without status migrainosus, not intractable Discussed diagnosis.  Sounds like migraines even though duration is a little less than what would be expected.  We will give a trial of Topamax to decrease the frequency of the headaches.  Her blood pressure is adequately controlled that we can use the Imitrex for abortive therapy.  I went over how to use the Imitrex.  Encouraged to use it at the start of the headache. - topiramate (TOPAMAX) 25 MG tablet; Take 1 tablet (25 mg total) by mouth at bedtime.  Dispense: 30 tablet; Refill: 2 - SUMAtriptan (IMITREX) 50 MG tablet; Take 1 tab  at start of headache.  May repeat in 2 hrs if no relief. Max 2 tabs/24 hrs.  Dispense: 10 tablet; Refill: 2     Patient was given the opportunity to ask questions.  Patient verbalized understanding of the plan and was able to repeat key elements of the plan.  [employer] interpreter used during this encounter. #[id number]  No orders of the defined types were placed in this encounter.    Requested Prescriptions    No prescriptions requested or ordered in this encounter    No follow-ups on file.  Jonah Blueeborah Lusero Nordlund, MD, FACP

## 2019-07-29 NOTE — Patient Instructions (Signed)
Cefalea migraosa Migraine Headache Una cefalea migraosa es un dolor muy intenso y punzante en uno o ambos lados de la cabeza. Este tipo de dolor de cabeza tambin puede causar otros sntomas. Puede durar desde 4horas hasta 3das. Hable con su mdico sobre las cosas que pueden causar (desencadenar) esta afeccin. Cules son las causas? Se desconoce la causa exacta de esta afeccin. Esta afeccin puede desencadenarse o ser causada por lo siguiente:  Consumo de alcohol.  Consumo de cigarrillos.  Tomar medicamentos como por ejemplo: ? Medicamentos para aliviar el dolor torcico (nitroglicerina). ? Anticonceptivos orales. ? Estrgeno. ? Algunos medicamentos para la presin arterial.  Comer o beber ciertos productos.  Hacer actividad fsica. Otros factores que pueden provocar cefalea migraosa son los siguientes:  Tener el perodo menstrual.  Embarazo.  Hambre.  Estrs.  No dormir lo suficiente o dormir demasiado.  Cambios climticos.  Cansancio (fatiga). Qu incrementa el riesgo?  Tener entre 25 y 55aos de edad.  Ser mujer.  Tener antecedentes familiares de cefalea migraosa.  Ser de raza caucsica.  Tener depresin o ansiedad.  Tener mucho sobrepeso. Cules son los signos o los sntomas?  Un dolor punzante. Este dolor puede tener las siguientes caractersticas: ? Puede aparecer en cualquier regin de la cabeza, tanto de un lado como de ambos. ? Puede dificultar las actividades cotidianas. ? Puede empeorar con la actividad fsica. ? Puede empeorar con las luces brillantes o los ruidos fuertes.  Otros sntomas pueden incluir: ? Ganas de vomitar (nuseas). ? Vmitos. ? Mareos. ? Sensibilidad a las luces brillantes, los ruidos fuertes o los olores.  Antes de tener una cefalea migraosa, puede recibir seales de advertencia (aura). Un aura puede incluir: ? Ver luces intermitentes o tener puntos ciegos. ? Ver puntos brillantes, halos o lneas en  zigzag. ? Tener una visin en tnel o visin borrosa. ? Sentir entumecimiento u hormigueo. ? Tener dificultad para hablar. ? Tener msculos dbiles.  Algunas personas tienen sntomas despus de una cefalea migraosa (fase posdromal), como los siguientes: ? Cansancio. ? Dificultad para pensar (concentrarse). Cmo se trata?  Tomar medicamentos para: ? Aliviar el dolor. ? Aliviar la sensacin de malestar estomacal. ? Prevenir las cefaleas migraosas.  El tratamiento tambin puede incluir lo siguiente: ? Tomar sesiones de acupuntura. ? Evitar los alimentos que provocan las cefaleas migraosas. ? Aprender maneras de controlar las funciones corporales (biorretroalimentacin). ? Terapia para ayudarlo a conocer y lidiar con los pensamientos negativos (terapia cognitivo conductual). Siga estas instrucciones en su casa: Medicamentos  Tome los medicamentos de venta libre y los recetados solamente como se lo haya indicado el mdico.  Consulte a su mdico si el medicamento que le recetaron: ? Hace que sea necesario que evite conducir o usar maquinaria pesada. ? Puede causarle dificultad para defecar (estreimiento). Es posible que deba tomar estas medidas para prevenir o tratar los problemas para defecar:  Beber suficiente lquido para mantener el pis (la orina) de color amarillo plido.  Tomar medicamentos recetados o de venta libre.  Comer alimentos ricos en fibra. Entre ellos, frijoles, cereales integrales y frutas y verduras frescas.  Limitar los alimentos con alto contenido de grasa y azcar. Estos incluyen alimentos fritos o dulces. Estilo de vida  No beba alcohol.  No consuma ningn producto que contenga nicotina o tabaco, como cigarrillos, cigarrillos electrnicos y tabaco de mascar. Si necesita ayuda para dejar de fumar, consulte al mdico.  Duerma como mnimo 8horas todas las noches.  Limite el estrs y manjelo. Indicaciones generales        Lleve un registro diario  para averiguar lo que puede provocar las cefaleas migraosas. Registre, por ejemplo, lo siguiente: ? Lo que usted come y bebe. ? El tiempo que duerme. ? Algn cambio en lo que come o bebe. ? Algn cambio en sus medicamentos.  Si tiene una cefalea migraosa: ? Evite los factores que empeoren los sntomas, como las luces brillantes. ? Resulta til acostarse en una habitacin oscura y silenciosa. ? No conduzca vehculos ni opere maquinaria pesada. ? Pregntele al mdico qu actividades son seguras para usted.  Concurra a todas las visitas de seguimiento como se lo haya indicado el mdico. Esto es importante. Comunquese con un mdico si:  Tiene una cefalea migraosa que es diferente o peor que otras que ha tenido.  Tiene ms de 15 das de cefalea por mes. Solicite ayuda inmediatamente si:  La cefalea migraosa empeora mucho.  La cefalea migraosa dura ms de 72 horas.  Tiene fiebre.  Presenta rigidez en el cuello.  Tiene dificultad para ver.  Siente debilidad en los msculos o que no puede controlarlos.  Comienza a perder el equilibrio continuamente.  Comienza a tener dificultad para caminar.  Pierde el conocimiento (se desmaya).  Tiene una convulsin. Resumen  Una cefalea migraosa es un dolor muy intenso y punzante en uno o ambos lados de la cabeza. Estos dolores de cabeza tambin pueden causar otros sntomas.  Esta afeccin puede tratarse con medicamentos y cambios en el estilo de vida.  Lleve un registro diario para averiguar lo que puede provocar las cefaleas migraosas.  Comunquese con un mdico si tiene una cefalea migraosa que es diferente o peor que otras que ha tenido.  Comunquese con el mdico si tiene ms de 15 das de cefalea en un mes. Esta informacin no tiene como fin reemplazar el consejo del mdico. Asegrese de hacerle al mdico cualquier pregunta que tenga. Document Released: 10/27/2008 Document Revised: 10/11/2018 Document Reviewed:  10/11/2018 Elsevier Patient Education  2020 [employer]  

## 2019-09-22 ENCOUNTER — Ambulatory Visit: Payer: Managed Care, Other (non HMO) | Admitting: Internal Medicine

## 2023-09-28 ENCOUNTER — Ambulatory Visit: Payer: Managed Care, Other (non HMO) | Admitting: Gastroenterology

## 2024-01-09 ENCOUNTER — Ambulatory Visit (INDEPENDENT_AMBULATORY_CARE_PROVIDER_SITE_OTHER): Payer: Self-pay | Admitting: Orthopaedic Surgery

## 2024-01-09 ENCOUNTER — Other Ambulatory Visit (INDEPENDENT_AMBULATORY_CARE_PROVIDER_SITE_OTHER): Payer: Self-pay

## 2024-01-09 DIAGNOSIS — M25562 Pain in left knee: Secondary | ICD-10-CM

## 2024-01-09 DIAGNOSIS — M25561 Pain in right knee: Secondary | ICD-10-CM

## 2024-01-09 DIAGNOSIS — G8929 Other chronic pain: Secondary | ICD-10-CM

## 2024-01-09 MED ORDER — METHYLPREDNISOLONE ACETATE 40 MG/ML IJ SUSP
40.0000 mg | INTRAMUSCULAR | Status: AC | PRN
  Administered 2024-01-09: 40 mg via INTRA_ARTICULAR

## 2024-01-09 MED ORDER — LIDOCAINE HCL 1 % IJ SOLN
2.0000 mL | INTRAMUSCULAR | Status: AC | PRN
  Administered 2024-01-09: 2 mL

## 2024-01-09 MED ORDER — BUPIVACAINE HCL 0.5 % IJ SOLN
2.0000 mL | INTRAMUSCULAR | Status: AC | PRN
  Administered 2024-01-09: 2 mL via INTRA_ARTICULAR

## 2024-01-09 NOTE — Progress Notes (Signed)
 Office Visit Note   Patient: Caroline Patton           Date of Birth: [birth year]           MRN: [id number] Visit Date: 01/09/2024              Requested by: Lawrance Presume, MD [address] [place] [address] [place],  [place] [address] PCP: Lawrance Presume, MD   Assessment & Plan: Visit Diagnoses:  1. Chronic pain of both knees     Plan: History of Present Illness Caroline Patton is a 53 year old female who presents with bilateral knee pain and swelling. She is accompanied by her daughter.  She has experienced bilateral knee pain for about a year, with the left knee more affected than the right. The pain is persistent and unrelieved by Tylenol  and Motrin. Significant swelling is present in both knees, affecting her ability to work, as she stands for 10 to 11 hours daily. There are no injuries or surgeries related to the knees.  Physical Exam MUSCULOSKELETAL: Bilateral knee with trace effusion with good range of motion, no crepitus, no joint line tenderness.  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 Pain and swelling present. No injury or surgical history. Mild effusion, good range of motion, intact ligaments. Knee replacement not required. Cortisone injections expected to provide better relief than oral steroids. - Administer cortisone injections to both knees. - Recommend knee braces for support. - Advise exercise program to strengthen periarticular muscles. - Discuss potential job modifications to reduce standing time.  Follow-Up Instructions: No follow-ups on file.   Orders:  Orders Placed This Encounter  Procedures   Large Joint Inj: bilateral knee   XR KNEE 3 VIEW LEFT   XR KNEE 3 VIEW RIGHT   No orders of the defined types were placed in this encounter.     Procedures: Large Joint Inj: bilateral knee on 01/09/2024 11:21 AM Indications: pain Details: 22 G needle  Arthrogram:  No  Medications (Right): 2 mL lidocaine 1 %; 2 mL bupivacaine 0.5 %; 40 mg methylPREDNISolone acetate 40 MG/ML Medications (Left): 2 mL lidocaine 1 %; 2 mL bupivacaine 0.5 %; 40 mg methylPREDNISolone acetate 40 MG/ML Outcome: tolerated well, no immediate complications Patient was prepped and draped in the usual sterile fashion.       Clinical Data: No additional findings.   Subjective: Chief Complaint  Patient presents with   Right Knee - Pain   Left Knee - Pain    HPI  Review of Systems  Constitutional: Negative.   HENT: Negative.    Eyes: Negative.   Respiratory: Negative.    Cardiovascular: Negative.   Endocrine: Negative.   Musculoskeletal: Negative.   Neurological: Negative.   Hematological: Negative.   Psychiatric/Behavioral: Negative.    All other systems reviewed and are negative.    Objective: Vital Signs: There were no vitals taken for this visit.  Physical Exam Vitals and nursing note reviewed.  Constitutional:      Appearance: She is well-developed.  HENT:     Head: Atraumatic.     Nose: Nose normal.  Eyes:     Extraocular Movements: Extraocular movements intact.  Cardiovascular:     Pulses: Normal pulses.  Pulmonary:     Effort: Pulmonary effort is normal.  Abdominal:     Palpations: Abdomen is soft.  Musculoskeletal:     Cervical back: Neck supple.  Skin:    General: Skin  is warm.     Capillary Refill: Capillary refill takes less than 2 seconds.  Neurological:     Mental Status: She is alert. Mental status is at baseline.  Psychiatric:        Behavior: Behavior normal.        Thought Content: Thought content normal.        Judgment: Judgment normal.     PMFS History: Patient Active Problem List   Diagnosis Date Noted   Migraine without aura and without status migrainosus, not intractable 07/29/2019   Prediabetes 06/11/2019   Grief reaction 06/10/2019   Essential hypertension 06/10/2019   Current mild episode of major  depressive disorder without prior episode (HCC) 06/10/2019   Past Medical History:  Diagnosis Date   Depression    Hypertension    Migraines     Family History  Problem Relation Age of Onset   Early death Son     Past Surgical History:  Procedure Laterality Date   NO PAST SURGERIES     Social History   Occupational History   Not on file  Tobacco Use   Smoking status: Never   Smokeless tobacco: Never  Vaping Use   Vaping status: Never Used  Substance and Sexual Activity   Alcohol use: No   Drug use: No   Sexual activity: Not on file

## 2024-09-12 ENCOUNTER — Encounter: Payer: Self-pay | Admitting: Gastroenterology
